# Patient Record
Sex: Male | Born: 1970
Health system: Southern US, Community
[De-identification: ages and names within clinical notes are randomized; demographics above are authoritative.]

## PROBLEM LIST (undated history)

## (undated) DIAGNOSIS — E785 Hyperlipidemia, unspecified: Secondary | ICD-10-CM

## (undated) DIAGNOSIS — E781 Pure hyperglyceridemia: Secondary | ICD-10-CM

## (undated) DIAGNOSIS — K219 Gastro-esophageal reflux disease without esophagitis: Secondary | ICD-10-CM

## (undated) DIAGNOSIS — I10 Essential (primary) hypertension: Secondary | ICD-10-CM

## (undated) DIAGNOSIS — N200 Calculus of kidney: Secondary | ICD-10-CM

## (undated) DIAGNOSIS — K760 Fatty (change of) liver, not elsewhere classified: Secondary | ICD-10-CM

## (undated) DIAGNOSIS — IMO0002 Reserved for concepts with insufficient information to code with codable children: Secondary | ICD-10-CM

## (undated) DIAGNOSIS — L4050 Arthropathic psoriasis, unspecified: Secondary | ICD-10-CM

## (undated) DIAGNOSIS — K861 Other chronic pancreatitis: Secondary | ICD-10-CM

## (undated) DIAGNOSIS — L400 Psoriasis vulgaris: Secondary | ICD-10-CM

## (undated) DIAGNOSIS — E119 Type 2 diabetes mellitus without complications: Secondary | ICD-10-CM

## (undated) DIAGNOSIS — K859 Acute pancreatitis without necrosis or infection, unspecified: Secondary | ICD-10-CM

## (undated) HISTORY — DX: Hyperlipidemia, unspecified: E78.5

## (undated) HISTORY — DX: Psoriasis vulgaris: L40.0

## (undated) HISTORY — DX: Fatty (change of) liver, not elsewhere classified: K76.0

## (undated) HISTORY — PX: ESOPHAGOGASTRODUODENOSCOPY: SHX1529

## (undated) HISTORY — DX: Reserved for concepts with insufficient information to code with codable children: IMO0002

## (undated) HISTORY — DX: Type 2 diabetes mellitus without complications: E11.9

## (undated) HISTORY — PX: OTHER SURGICAL HISTORY: SHX169

## (undated) HISTORY — PX: CHOLECYSTECTOMY OPEN: SUR202

## (undated) HISTORY — DX: Acute pancreatitis without necrosis or infection, unspecified: K85.90

## (undated) HISTORY — DX: Other chronic pancreatitis: K86.1

## (undated) HISTORY — DX: Pure hyperglyceridemia: E78.1

## (undated) HISTORY — DX: Calculus of kidney: N20.0

## (undated) HISTORY — DX: Gastro-esophageal reflux disease without esophagitis: K21.9

## (undated) HISTORY — DX: Arthropathic psoriasis, unspecified: L40.50

---

## 2014-08-03 ENCOUNTER — Encounter: Payer: Self-pay | Admitting: *Deleted

## 2014-08-05 ENCOUNTER — Ambulatory Visit (INDEPENDENT_AMBULATORY_CARE_PROVIDER_SITE_OTHER): Payer: BLUE CROSS/BLUE SHIELD | Admitting: Cardiology

## 2014-08-05 ENCOUNTER — Encounter: Payer: Self-pay | Admitting: Cardiology

## 2014-08-05 VITALS — BP 134/96 | HR 95 | Ht 60.0 in | Wt 180.0 lb

## 2014-08-05 DIAGNOSIS — K861 Other chronic pancreatitis: Secondary | ICD-10-CM

## 2014-08-05 DIAGNOSIS — IMO0002 Reserved for concepts with insufficient information to code with codable children: Secondary | ICD-10-CM

## 2014-08-05 DIAGNOSIS — E119 Type 2 diabetes mellitus without complications: Secondary | ICD-10-CM | POA: Insufficient documentation

## 2014-08-05 DIAGNOSIS — E781 Pure hyperglyceridemia: Secondary | ICD-10-CM

## 2014-08-05 DIAGNOSIS — E1169 Type 2 diabetes mellitus with other specified complication: Secondary | ICD-10-CM

## 2014-08-05 DIAGNOSIS — E1165 Type 2 diabetes mellitus with hyperglycemia: Secondary | ICD-10-CM

## 2014-08-05 HISTORY — DX: Pure hyperglyceridemia: E78.1

## 2014-08-05 HISTORY — DX: Other chronic pancreatitis: K86.1

## 2014-08-05 NOTE — Progress Notes (Signed)
Cardiology Office Note   Date:  08/05/2014   ID:  Marcus Paddynson J Lefebre, DOB 02-03-1971, MRN [id number]  PCP:  No primary care provider on file.  Cardiologist:   Quintella ReichertURNER,Naelle Diegel R, MD   No chief complaint on file.     History of Present Illness: Marcus Mitchell is a 44 y.o. male who presents for evaluation of hypertriglyceridemia.  He has a history of pancreatitis and was [facility]. He thinks he has had 12 episodes of pancreatitis in the past 3-4 years.  He is intolerant to Niacin and is taking crestor, fenofibrate and fish oil.  His most recent triglyceride level was 940.  He tries to follow a healthy diet.  He was recently placed on Metformin.  His triglycerides have been as high as 6000.  He has no family history of CAD in immediate family members.  Her denies any chest pain, pressure or SOB.  He gets chronic epigastric pain from his pancreatitis.  He denies any LE edema but denies any dizziness or syncope.  He denies palpitations.  He tries to follow a low fat diet but is not always compliant.  He eats out a lot of fast food.  He rarely exercises.     Past Medical History  Diagnosis Date  . Diabetes mellitus type II, controlled   . Kidney stones   . Plaque psoriasis   . Psoriatic arthritis   . Hyperlipemia   . Hypertriglyceridemia   . Pancreatitis, recurrent   . NAFLD (nonalcoholic fatty liver disease)   . GERD (gastroesophageal reflux disease)     Past Surgical History  Procedure Laterality Date  . Cholecystectomy open    . Esophagogastroduodenoscopy    . Duodenitis       Current Outpatient Prescriptions  Medication Sig Dispense Refill  . fenofibrate micronized (LOFIBRA) 134 MG capsule Take 134 mg by mouth daily. daily.    [name]. HUMIRA PEN 40 MG/0.8ML PNKT Every two weeks.  2  . Krill Oil 1000 MG CAPS Take by mouth daily.     [name]. loratadine (CLARITIN) 10 MG tablet Take 10 mg by mouth daily.    . metFORMIN (GLUCOPHAGE) 500 MG tablet 2 (two) times daily.      [name]. omeprazole (PRILOSEC OTC) 20 MG tablet Take 20 mg by mouth.    . rosuvastatin (CRESTOR) 10 MG tablet Take 10 mg by mouth daily. daily.     No current facility-administered medications for this visit.    Allergies:   Nsaids and Niacin and related    Social History:  The patient  reports that he has quit smoking. He does not have any smokeless tobacco history on file. He reports that he does not drink alcohol or use illicit drugs.   Family History:  The patient's family history includes Diabetes in his mother; Hyperlipidemia in his father; Hypertension in his mother.    ROS:  Please see the history of present illness.   Otherwise, review of systems are positive for none.   All other systems are reviewed and negative.    PHYSICAL EXAM: VS:  BP 134/96 mmHg  Pulse 95  Ht 5' (1.524 m)  Wt 180 lb (81.647 kg)  BMI 35.15 kg/m2 , BMI Body mass index is 35.15 kg/(m^2). GEN: Well nourished, well developed, in no acute distress HEENT: normal Neck: no JVD, carotid bruits, or masses Cardiac: RRR; no murmurs, rubs, or gallops,no edema  Respiratory:  clear to auscultation bilaterally, normal work of breathing GI: soft,  nontender, nondistended, + BS MS: no deformity or atrophy Skin: warm and dry, no rash Neuro:  Strength and sensation are intact Psych: euthymic mood, full affect   EKG:  EKG is ordered today. The ekg ordered today demonstrates NSR with no ST chagnes   Recent Labs: No results found for requested labs within last 365 days.    Lipid Panel No results found for: CHOL, TRIG, HDL, CHOLHDL, VLDL, LDLCALC, LDLDIRECT    Wt Readings from Last 3 Encounters:  08/05/14 180 lb (81.647 kg)      Other studies Reviewed: Additional studies/ records that were reviewed today include: records from PCP. Review of the above records demonstrates: severe hypertriglyceridemia   ASSESSMENT AND PLAN:  1.  Hypertriglyceridemia with levels as high at 6000 in the past resulting in  multiple episodes of acute pancreatitis.  This is most likely familial. He has no family history of heart disease.  He is on Crestor, fish oil and fenofibrate but has not tolerated Niaspan.  His last triglycerides were still elevated at 900.  He is not very compliant in following a low fat diet and eats fast food a lot.  He also does not exercise. I had a long conversation with him about the importance of joining and gym and getting into a routine exercise program as well as following a strict diet.  I will refer him to our lipid clinic as well as a nutritionist. 2.  Chronic pancreatitis due to #1 3.  DM type 2 - per PCP.  Currently on Metformin   Current medicines are reviewed at length with the patient today.  The patient does not have concerns regarding medicines.  The following changes have been made:  no change  Labs/ tests ordered today include: Non e No orders of the defined types were placed in this encounter.     Disposition:   FU with me in 6 months   Signed, Quintella Reichert, MD  08/05/2014 2:35 PM    [facility] [address] [place], [place], [place]  [address] Phone: [phone]; Fax: [phone]

## 2014-08-05 NOTE — Patient Instructions (Addendum)
You have been referred to Lipid Clinic.  You have been referred for a nutrition consult.  Your physician wants you to follow-up in: 6 months with Dr. Mayford Knifeurner. You will receive a reminder letter in the mail two months in advance. If you don't receive a letter, please call our office to schedule the follow-up appointment.

## 2014-08-12 ENCOUNTER — Ambulatory Visit (INDEPENDENT_AMBULATORY_CARE_PROVIDER_SITE_OTHER): Payer: BLUE CROSS/BLUE SHIELD | Admitting: Pharmacist

## 2014-08-12 DIAGNOSIS — E781 Pure hyperglyceridemia: Secondary | ICD-10-CM

## 2014-08-12 MED ORDER — OMEGA-3-ACID ETHYL ESTERS 1 G PO CAPS: 2.0000 g | ORAL_CAPSULE | Freq: Two times a day (BID) | ORAL | Status: DC

## 2014-08-12 MED ORDER — ROSUVASTATIN CALCIUM 10 MG PO TABS: 10.0000 mg | ORAL_TABLET | Freq: Every day | ORAL | Status: DC

## 2014-08-12 NOTE — Patient Instructions (Signed)
Change from Krill Oil to the prescription fish oil capsules (Lovaza)- 4 capsules per day.  Try to limit the amount of sugars, carbohydrates and fried foods in your diet.   Set a goal to exercise at least 15 minutes for 3-4 days of the week.   We will recheck your labs in 2 months.

## 2014-08-16 NOTE — Progress Notes (Signed)
HPI  [name] is a 44 yo M patient of Dr. Mayford Mitchell who was referred to the Lipid Clinic for a history of hypertriglyceridemia.  He has a history of pancreatitis and thinks he has had 12 episodes of pancreatitis in the past 3-4 years.  His most severe episode was last fall when he was admitted to the hospital.  His triglycerides have been as high as 6000 in the past but most recently they were 940 mg/dL.  He is currently taking Crestor 10mg  daily, fenofibrate 134 mg daily, and 1 Mega Red fish oil capsule a day.  He state he is intolerant to Niacin.  He was recently started on metformin but does not check his blood sugars at home.   Family history- non-significant.  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  Reports no alcohol intake   Dietary Review:  Breakfast- cereal such as Cheerios or Special K or a sandwich Lunch- has fast food ~50% of the time.  Likes [employer].  Will do a sit down meal at a restaurant the other time.  Will have a grilled chicken plate with vegetables or [place].  Tries to eat a salad at least once a week.   Dinner- Cooks at home 3-4 nights a week.  Likes pork roast, chicken, corn, green beans, asparagus, grilled peppers and onions.  Drinks coffee, flavored water, a few Diet Cokes Snacks on [employer] bars and does have a least one sweet per day.   Exercise:  Pt does not exercise at all now.    Current Outpatient Prescriptions  Medication Sig Dispense Refill  . fenofibrate micronized (LOFIBRA) 134 MG capsule Take 134 mg by mouth daily. daily.    [name]. HUMIRA PEN 40 MG/0.8ML PNKT Every two weeks.  2  . loratadine (CLARITIN) 10 MG tablet Take 10 mg by mouth daily.    . metFORMIN (GLUCOPHAGE) 500 MG tablet 2 (two) times daily.    [name]. omega-3 acid ethyl esters (LOVAZA) 1 G capsule Take 2 capsules (2 g total) by mouth 2 (two) times daily. 120 capsule 6  . omeprazole (PRILOSEC OTC) 20 MG tablet Take 20 mg by mouth.    . rosuvastatin (CRESTOR) 10 MG tablet  Take 1 tablet (10 mg total) by mouth daily. 90 tablet 1   No current facility-administered medications for this visit.   Allergies  Allergen Reactions  . Nsaids Hives  . Niacin And Related    Assessment and Plan 1.  Hypertriglyceridemia- Pt has a long history of elevated TG.  Unfortunately we do not have any copies of the labs.  He is on fenofibrate and statin.  Would increase fish oil to 4 gm per day.  Will send in Rx version since it is now generic.  Have also encouraged lifestyle improvements, mainly exercise.  Plan to recheck labs in 2 months.

## 2014-10-12 ENCOUNTER — Other Ambulatory Visit (INDEPENDENT_AMBULATORY_CARE_PROVIDER_SITE_OTHER): Payer: BLUE CROSS/BLUE SHIELD | Admitting: *Deleted

## 2014-10-12 DIAGNOSIS — E781 Pure hyperglyceridemia: Secondary | ICD-10-CM | POA: Diagnosis not present

## 2014-10-12 LAB — LIPID PANEL
Cholesterol: 185 mg/dL (ref 0–200)
HDL: 26.1 mg/dL — ABNORMAL LOW (ref >39.00)
Total CHOL/HDL Ratio: 7
Triglycerides: 986 mg/dL — ABNORMAL HIGH (ref 0.0–149.0)

## 2014-10-12 LAB — HEPATIC FUNCTION PANEL
ALBUMIN: 4.6 g/dL (ref 3.5–5.2)
ALT: 50 U/L (ref 0–53)
AST: 27 U/L (ref 0–37)
Alkaline Phosphatase: 46 U/L (ref 39–117)
BILIRUBIN TOTAL: 0.7 mg/dL (ref 0.2–1.2)
Bilirubin, Direct: 0.1 mg/dL (ref 0.0–0.3)
Total Protein: 7 g/dL (ref 6.0–8.3)

## 2014-10-12 LAB — LDL CHOLESTEROL, DIRECT: LDL DIRECT: 31 mg/dL

## 2014-10-12 NOTE — Addendum Note (Signed)
Addended by: Tonita PhoenixBOWDEN, Eleyna Brugh K on: 10/12/2014 08:33 AM   Modules accepted: Orders

## 2014-10-21 ENCOUNTER — Ambulatory Visit: Payer: BLUE CROSS/BLUE SHIELD | Admitting: Pharmacist

## 2014-11-24 ENCOUNTER — Encounter: Payer: Self-pay | Admitting: Cardiology

## 2014-11-25 ENCOUNTER — Encounter: Payer: Self-pay | Admitting: Cardiology

## 2015-02-09 ENCOUNTER — Other Ambulatory Visit: Payer: Self-pay | Admitting: Cardiology

## 2015-02-09 NOTE — Telephone Encounter (Signed)
Please advise on refill as patient no-showed his 10/21/14 lipid clinic appointment. Thanks, MI

## 2015-04-23 ENCOUNTER — Other Ambulatory Visit: Payer: Self-pay | Admitting: Cardiology

## 2015-04-25 NOTE — Telephone Encounter (Signed)
Please advise on refill request as patient is overdue for follow up in the lipid clinic. Thanks, MI

## 2015-05-19 ENCOUNTER — Other Ambulatory Visit: Payer: Self-pay | Admitting: Cardiology

## 2015-07-18 ENCOUNTER — Encounter (HOSPITAL_COMMUNITY): Payer: Self-pay | Admitting: *Deleted

## 2015-07-18 ENCOUNTER — Other Ambulatory Visit: Payer: Self-pay | Admitting: Gastroenterology

## 2015-07-19 NOTE — Anesthesia Preprocedure Evaluation (Signed)
Anesthesia Evaluation  Patient identified by MRN, date of birth, ID band Patient awake    Reviewed: Allergy & Precautions, H&P , NPO status , Patient's Chart, lab work & pertinent test results  Airway Mallampati: II  TM Distance: >3 FB Neck ROM: full    Dental no notable dental hx. (+) Teeth Intact, Dental Advisory Given   Pulmonary neg pulmonary ROS, former smoker,    Pulmonary exam normal breath sounds clear to auscultation       Cardiovascular Exercise Tolerance: Good hypertension, Pt. on medications negative cardio ROS Normal cardiovascular exam Rhythm:regular Rate:Normal     Neuro/Psych negative neurological ROS  negative psych ROS   GI/Hepatic negative GI ROS, Neg liver ROS, GERD  Medicated and Controlled,  Endo/Other  negative endocrine ROSdiabetes, Well Controlled, Type 2, Oral Hypoglycemic Agents  Renal/GU negative Renal ROS  negative genitourinary   Musculoskeletal   Abdominal   Peds  Hematology negative hematology ROS (+)   Anesthesia Other Findings   Reproductive/Obstetrics negative OB ROS                             Anesthesia Physical Anesthesia Plan  ASA: III  Anesthesia Plan: MAC   Post-op Pain Management:    Induction:   Airway Management Planned:   Additional Equipment:   Intra-op Plan:   Post-operative Plan:   Informed Consent: I have reviewed the patients History and Physical, chart, labs and discussed the procedure including the risks, benefits and alternatives for the proposed anesthesia with the patient or authorized representative who has indicated his/her understanding and acceptance.   Dental Advisory Given  Plan Discussed with: CRNA and Surgeon  Anesthesia Plan Comments:         Anesthesia Quick Evaluation

## 2015-07-20 ENCOUNTER — Ambulatory Visit (HOSPITAL_COMMUNITY)
Admission: RE | Admit: 2015-07-20 | Discharge: 2015-07-20 | Disposition: A | Discharge status: Home / Self Care | Payer: BLUE CROSS/BLUE SHIELD | Source: Ambulatory Visit | Attending: Gastroenterology | Admitting: Gastroenterology

## 2015-07-20 ENCOUNTER — Encounter (HOSPITAL_COMMUNITY)
Admission: RE | Disposition: A | Discharge status: Home / Self Care | Payer: Self-pay | Source: Ambulatory Visit | Attending: Gastroenterology

## 2015-07-20 ENCOUNTER — Ambulatory Visit (HOSPITAL_COMMUNITY): Payer: BLUE CROSS/BLUE SHIELD | Admitting: Anesthesiology

## 2015-07-20 ENCOUNTER — Encounter (HOSPITAL_COMMUNITY): Payer: Self-pay | Admitting: *Deleted

## 2015-07-20 DIAGNOSIS — I10 Essential (primary) hypertension: Secondary | ICD-10-CM | POA: Diagnosis not present

## 2015-07-20 DIAGNOSIS — R109 Unspecified abdominal pain: Secondary | ICD-10-CM | POA: Diagnosis present

## 2015-07-20 DIAGNOSIS — K861 Other chronic pancreatitis: Secondary | ICD-10-CM | POA: Diagnosis not present

## 2015-07-20 DIAGNOSIS — Z7984 Long term (current) use of oral hypoglycemic drugs: Secondary | ICD-10-CM | POA: Insufficient documentation

## 2015-07-20 DIAGNOSIS — K219 Gastro-esophageal reflux disease without esophagitis: Secondary | ICD-10-CM | POA: Diagnosis not present

## 2015-07-20 DIAGNOSIS — K8681 Exocrine pancreatic insufficiency: Secondary | ICD-10-CM | POA: Diagnosis not present

## 2015-07-20 DIAGNOSIS — E119 Type 2 diabetes mellitus without complications: Secondary | ICD-10-CM | POA: Insufficient documentation

## 2015-07-20 DIAGNOSIS — Z9049 Acquired absence of other specified parts of digestive tract: Secondary | ICD-10-CM | POA: Insufficient documentation

## 2015-07-20 DIAGNOSIS — Z79899 Other long term (current) drug therapy: Secondary | ICD-10-CM | POA: Diagnosis not present

## 2015-07-20 DIAGNOSIS — E781 Pure hyperglyceridemia: Secondary | ICD-10-CM | POA: Diagnosis not present

## 2015-07-20 DIAGNOSIS — Z87891 Personal history of nicotine dependence: Secondary | ICD-10-CM | POA: Diagnosis not present

## 2015-07-20 HISTORY — PX: EUS: SHX5427

## 2015-07-20 HISTORY — DX: Essential (primary) hypertension: I10

## 2015-07-20 LAB — GLUCOSE, CAPILLARY: Glucose-Capillary: 154 mg/dL — ABNORMAL HIGH (ref 65–99)

## 2015-07-20 SURGERY — UPPER ENDOSCOPIC ULTRASOUND (EUS) RADIAL
Anesthesia: Monitor Anesthesia Care

## 2015-07-20 MED ORDER — PROPOFOL 10 MG/ML IV BOLUS
INTRAVENOUS | Status: AC
  Filled 2015-07-20: qty 40

## 2015-07-20 MED ORDER — LIDOCAINE HCL (CARDIAC) 20 MG/ML IV SOLN
INTRAVENOUS | Status: DC | PRN
  Administered 2015-07-20: 100 mg via INTRAVENOUS

## 2015-07-20 MED ORDER — SODIUM CHLORIDE 0.9 % IV SOLN: INTRAVENOUS | Status: DC

## 2015-07-20 MED ORDER — PROPOFOL 10 MG/ML IV BOLUS
INTRAVENOUS | Status: DC | PRN
  Administered 2015-07-20: 20 mg via INTRAVENOUS
  Administered 2015-07-20: 10 mg via INTRAVENOUS
  Administered 2015-07-20: 20 mg via INTRAVENOUS
  Administered 2015-07-20: 50 mg via INTRAVENOUS
  Administered 2015-07-20: 20 mg via INTRAVENOUS

## 2015-07-20 MED ORDER — LACTATED RINGERS IV SOLN: INTRAVENOUS | Status: DC

## 2015-07-20 MED ORDER — LIDOCAINE HCL (CARDIAC) 20 MG/ML IV SOLN
INTRAVENOUS | Status: AC
  Filled 2015-07-20: qty 5

## 2015-07-20 MED ORDER — FENTANYL CITRATE (PF) 100 MCG/2ML IJ SOLN
INTRAMUSCULAR | Status: AC
  Filled 2015-07-20: qty 2

## 2015-07-20 MED ORDER — FENTANYL CITRATE (PF) 100 MCG/2ML IJ SOLN
INTRAMUSCULAR | Status: DC | PRN
  Administered 2015-07-20 (×2): 50 ug via INTRAVENOUS

## 2015-07-20 MED ORDER — FENTANYL CITRATE (PF) 100 MCG/2ML IJ SOLN: 25.0000 ug | INTRAMUSCULAR | Status: DC | PRN

## 2015-07-20 MED ORDER — LACTATED RINGERS IV SOLN
INTRAVENOUS | Status: DC
  Administered 2015-07-20: 1000 mL via INTRAVENOUS

## 2015-07-20 MED ORDER — PROPOFOL 500 MG/50ML IV EMUL
INTRAVENOUS | Status: DC | PRN
  Administered 2015-07-20: 140 ug/kg/min via INTRAVENOUS

## 2015-07-20 MED ORDER — MIDAZOLAM HCL 5 MG/5ML IJ SOLN
INTRAMUSCULAR | Status: DC | PRN
  Administered 2015-07-20: 1 mg via INTRAVENOUS

## 2015-07-20 MED ORDER — MIDAZOLAM HCL 2 MG/2ML IJ SOLN
INTRAMUSCULAR | Status: AC
  Filled 2015-07-20: qty 2

## 2015-07-20 NOTE — Op Note (Signed)
[facility] [address] [place], [address]   ENDOSCOPIC ULTRASOUND PROCEDURE REPORT  PATIENT: Marcus Mitchell, Marcus Mitchell  MR#: [id number] BIRTHDATE: 1970/07/11  GENDER: male ENDOSCOPIST: Willis Modena, MD REFERRED BY:  Charlott Rakes, M.D. PROCEDURE DATE:  07/20/2015 PROCEDURE:   Upper EUS ASA CLASS:      Class III INDICATIONS:   1.  abdominal pain, recurrent pancreatitis. MEDICATIONS: Monitored anesthesia care  DESCRIPTION OF PROCEDURE:   After the risks benefits and alternatives of the procedure were  explained, informed consent was obtained. The patient was then placed in the left, lateral, decubitus postion and IV sedation was administered. Throughout the procedure, the patients blood pressure, pulse and oxygen saturations were monitored continuously.  Under direct visualization, the radial forward-viewing  echoendoscope was introduced through the mouth  and advanced to the second portion of the duodenum .  Water was used as necessary to provide an acoustic interface. Estimated blood loss is zero unless otherwise noted in this procedure report. Upon completion of the imaging, water was removed and the patient was sent to the recovery room in satisfactory condition.    FINDINGS:      EGD:  Limited EGD normal esophagus, stomach, pylorus and duodenum to the second portion. EUS:  Extensive hyperechoic strands, hyperechoic foci and lobularity, findings readily compatible with chronic pancreatitis. No pancreatic mass seen.  Bile duct non-dilated and no bile duct stones seen.  Post-cholecystectomy.  IMPRESSION:     As above.  Chronic pancreatitis noted.  RECOMMENDATIONS:     1.  Watch for potential complications of procedure. 2.  Trial of pancreatic enzymes. 3.  Patient needs more rigorous control of triglyerides; endocrinologist with expertise in lipid management might be useful for this patient's extremely medically refractory  hereditary hypertriglyceridemia. 4.  Follow-up with Dr. Bosie Clos.    _______________________________ Rosalie DoctorWillis Modena, MD 07/20/2015 9:11 AM   CC:

## 2015-07-20 NOTE — Anesthesia Postprocedure Evaluation (Signed)
Anesthesia Post Note  Patient: Marcus Mitchell  Procedure(s) Performed: Procedure(s) (LRB): UPPER ENDOSCOPIC ULTRASOUND (EUS) RADIAL (N/A)  Patient location during evaluation: PACU Anesthesia Type: MAC Level of consciousness: awake and alert Pain management: pain level controlled Vital Signs Assessment: post-procedure vital signs reviewed and stable Respiratory status: spontaneous breathing, nonlabored ventilation, respiratory function stable and patient connected to nasal cannula oxygen Cardiovascular status: blood pressure returned to baseline and stable Postop Assessment: no signs of nausea or vomiting Anesthetic complications: no    Last Vitals:  Filed Vitals:   07/20/15 0930 07/20/15 0935  BP: 135/89   Pulse: 70 64  Temp:    Resp: 21 22    Last Pain: There were no vitals filed for this visit.               Peityn Payton L

## 2015-07-20 NOTE — H&P (Signed)
Patient interval history reviewed.  Patient examined again.  There has been no change from documented H/P dated 07/14/15 (scanned into chart from our office) except as documented above.  Assessment:  1.  Recurrent pancreatitis, possibly hypertrigylceridemia-related. 2.  Abdominal pain.  Plan:  1.  Endoscopic ultrasound. 2.  Risks (bleeding, infection, bowel perforation that could require surgery, sedation-related changes in cardiopulmonary systems), benefits (identification and possible treatment of source of symptoms, exclusion of certain causes of symptoms), and alternatives (watchful waiting, radiographic imaging studies, empiric medical treatment) of upper endoscopy with ultrasound (EUS) were explained to patient/family in detail and patient wishes to proceed.

## 2015-07-20 NOTE — Discharge Instructions (Signed)
Endoscopic Ultrasound ° °Care After °Please read the instructions outlined below and refer to this sheet in the next few weeks. These discharge instructions provide you with general information on caring for yourself after you leave the hospital. Your doctor may also give you specific instructions. While your treatment has been planned according to the most current medical practices available, unavoidable complications occasionally occur. If you have any problems or questions after discharge, please call Dr. Kashtyn Jankowski ([facility]) at [phone]. ° °HOME CARE INSTRUCTIONS °Activity °· You may resume your regular activity but move at a slower pace for the next 24 hours.  °· Take frequent rest periods for the next 24 hours.  °· Walking will help expel (get rid of) the air and reduce the bloated feeling in your abdomen.  °· No driving for 24 hours (because of the anesthesia (medicine) used during the test).  °· You may shower.  °· Do not sign any important legal documents or operate any machinery for 24 hours (because of the anesthesia used during the test).  °Nutrition °· Drink plenty of fluids.  °· You may resume your normal diet.  °· Begin with a light meal and progress to your normal diet.  °· Avoid alcoholic beverages for 24 hours or as instructed by your caregiver.  °Medications °You may resume your normal medications unless your caregiver tells you otherwise. °What you can expect today °· You may experience abdominal discomfort such as a feeling of fullness or "gas" pains.  °· You may experience a sore throat for 2 to 3 days. This is normal. Gargling with salt water may help this.  °·  °SEEK IMMEDIATE MEDICAL CARE IF: °· You have excessive nausea (feeling sick to your stomach) and/or vomiting.  °· You have severe abdominal pain and distention (swelling).  °· You have trouble swallowing.  °· You have a temperature over 100° F (37.8° C).  °· You have rectal bleeding or vomiting of blood.  °Document  Released: 12/20/2003 Document Revised: 01/17/2011 Document Reviewed: 07/02/2007 °ExitCare® Patient Information ©2012 [employer]. °

## 2015-07-20 NOTE — Transfer of Care (Signed)
Immediate Anesthesia Transfer of Care Note  Patient: Marcus Mitchell  Procedure(s) Performed: Procedure(s): UPPER ENDOSCOPIC ULTRASOUND (EUS) RADIAL (N/A)  Patient Location: Endoscopy Unit  Anesthesia Type:MAC  Level of Consciousness: awake  Airway & Oxygen Therapy: Patient Spontanous Breathing  Post-op Assessment: Report given to RN and Post -op Vital signs reviewed and stable  Post vital signs: Reviewed and stable  Last Vitals:  Filed Vitals:   07/20/15 0726  BP: 138/93  Pulse: 75  Temp: 36.7 C  Resp: 10    Complications: No apparent anesthesia complications

## 2015-07-25 ENCOUNTER — Encounter (HOSPITAL_COMMUNITY): Payer: Self-pay | Admitting: Gastroenterology

## 2015-08-25 DIAGNOSIS — I10 Essential (primary) hypertension: Secondary | ICD-10-CM | POA: Diagnosis not present

## 2015-08-25 DIAGNOSIS — E1165 Type 2 diabetes mellitus with hyperglycemia: Secondary | ICD-10-CM | POA: Diagnosis not present

## 2015-08-25 DIAGNOSIS — E785 Hyperlipidemia, unspecified: Secondary | ICD-10-CM | POA: Diagnosis not present

## 2015-08-25 DIAGNOSIS — D696 Thrombocytopenia, unspecified: Secondary | ICD-10-CM | POA: Diagnosis not present

## 2015-09-14 DIAGNOSIS — E669 Obesity, unspecified: Secondary | ICD-10-CM | POA: Diagnosis not present

## 2015-09-14 DIAGNOSIS — E781 Pure hyperglyceridemia: Secondary | ICD-10-CM | POA: Diagnosis not present

## 2015-09-16 DIAGNOSIS — E781 Pure hyperglyceridemia: Secondary | ICD-10-CM | POA: Diagnosis not present

## 2015-09-16 DIAGNOSIS — E669 Obesity, unspecified: Secondary | ICD-10-CM | POA: Diagnosis not present

## 2015-10-11 DIAGNOSIS — M67911 Unspecified disorder of synovium and tendon, right shoulder: Secondary | ICD-10-CM | POA: Diagnosis not present

## 2015-10-14 ENCOUNTER — Ambulatory Visit: Payer: BLUE CROSS/BLUE SHIELD | Admitting: Skilled Nursing Facility1

## 2015-10-18 DIAGNOSIS — M25511 Pain in right shoulder: Secondary | ICD-10-CM | POA: Diagnosis not present

## 2015-10-20 DIAGNOSIS — M67911 Unspecified disorder of synovium and tendon, right shoulder: Secondary | ICD-10-CM | POA: Diagnosis not present

## 2015-10-25 DIAGNOSIS — M25511 Pain in right shoulder: Secondary | ICD-10-CM | POA: Diagnosis not present

## 2015-11-02 DIAGNOSIS — M25511 Pain in right shoulder: Secondary | ICD-10-CM | POA: Diagnosis not present

## 2015-11-04 DIAGNOSIS — M25511 Pain in right shoulder: Secondary | ICD-10-CM | POA: Diagnosis not present

## 2015-11-07 DIAGNOSIS — M25511 Pain in right shoulder: Secondary | ICD-10-CM | POA: Diagnosis not present

## 2015-11-14 ENCOUNTER — Encounter: Payer: Self-pay | Admitting: Dietician

## 2015-11-14 ENCOUNTER — Encounter: Payer: BLUE CROSS/BLUE SHIELD | Attending: Internal Medicine | Admitting: Dietician

## 2015-11-14 VITALS — Ht 65.0 in | Wt 182.0 lb

## 2015-11-14 DIAGNOSIS — M25511 Pain in right shoulder: Secondary | ICD-10-CM | POA: Diagnosis not present

## 2015-11-14 DIAGNOSIS — E119 Type 2 diabetes mellitus without complications: Secondary | ICD-10-CM

## 2015-11-14 DIAGNOSIS — Z713 Dietary counseling and surveillance: Secondary | ICD-10-CM | POA: Insufficient documentation

## 2015-11-14 DIAGNOSIS — E781 Pure hyperglyceridemia: Secondary | ICD-10-CM | POA: Diagnosis not present

## 2015-11-14 NOTE — Progress Notes (Signed)
Medical Nutrition Therapy:  Appt start time: 1530 end time:  1645.   Assessment:  Primary concerns today: Patient is here alone for referral for severe hypertriglyceridemia and type 2 diabetes.  Patient wants to learn how to eat better and clear up confusion about what this means.  Hx includes:  Type 2 diabetes, GERD, hypertriglyceridemia, HTN, CKD, psoriasis and psoriatic arthritis, pancreatitis (due to extremely elevated triglycerides).  Labs:  Cholesterol 185, Triglycerides 986, HDL 26, LDL 31.  He cannot remember what his last A1C was but states that it is good controlled.    Weight hx: 180 for years 182 today  Patient lives with his wife and child.  Both share the shopping and cooking.  Wife cooks "very Southern".  He is an Youth workerassistant pastor.  Preferred Learning Style:   No preference indicated   Learning Readiness:   Ready  MEDICATIONS: see list to include Metformin, humira, fenofibrate, omega-3, and crestor.   DIETARY INTAKE:  Usual eating pattern includes 2-3 meals and 1-2 snacks per day.  Everyday foods include eating out.  Avoided foods include regular and diet soda, alcohol.    24-hr recall:  B ( AM): PB & Jelly sandwich OR Bacon, egg, and cheese biscuit OR just coffee   Snk ( AM): granola bar or Belvita  L ( PM): Out to eat (fast food and sit down) Snk ( PM): none D (6 PM): chicken and broccoli and sometimes a starch, occasionally fried squash OR spaghetti, garlic bread, salad Snk ( PM): cereal (captain crunch) Beverages: water, flavored water, coffee with cream and artificial sweetener (2x/week), hot tea  Usual physical activity: yard work, occasional basketball or Frisbee with his son.  Estimated energy needs: 1600 calories 200 g carbohydrates 120 g protein 40 g fat  Progress Towards Goal(s):  In progress.   Nutritional Diagnosis:  NB-1.1 Food and nutrition-related knowledge deficit As related to balance of carbohydrates, protein, and fat.  As evidenced  by diet hx.    Intervention:  Nutrition counseling/education related to hypertriglyceridemia and type 2 diabetes.  Recommendations appropriate for slow weight loss.  Discussed importance of a low saturated fat diet limited in total fat with moderate carbohydrates.  Discussed carb counting by portion size, importance of exercise and learning more about food chosen when out to eat. Discussed mindful eating.  Aim for 3 Carb Choices per meal (45 grams) +/- 1 either way  Aim for 0-1 Carbs per snack if hungry  Include protein in moderation with your meals and snacks Read food labels for Total Carbohydrate and Fat Grams and saturated fat   Limit fat to 40-50 grams per day. Consider taking medication as directed by MD  Stay as active as possible.  Aim for 30 minutes most days.  Choose things that you enjoy. Variety is good. Consider computer resources or apps for further information about foods eaten out.  Calorie King.com  My Fitness Pal  Google American Expres[employer]he restaurant name and nutrition to get the nutrition content of their menu Increase your non starchy vegetables, choose whole grains and dried beans.  These are all great sources of fiber. Choose mindfully, eat slowly, stop when you are satisfied.  Teaching Method Utilized:  Visual Auditory Hands on  Handouts given during visit include:  Hypertriglyceridemia Nutrition Therapy  Hypertriglyceridemia Meal planning tips  Pancreatitis nutrition therapy  Meal plan card  Label reading  My plate  Barriers to learning/adherence to lifestyle change: none  Demonstrated degree of understanding via:  Teach Back  Monitoring/Evaluation:  Dietary intake, exercise, label readind, and body weight prn.

## 2015-11-14 NOTE — Patient Instructions (Addendum)
Aim for 3 Carb Choices per meal (45 grams) +/- 1 either way  Aim for 0-1 Carbs per snack if hungry  Include protein in moderation with your meals and snacks Read food labels for Total Carbohydrate and Fat Grams and saturated fat   Limit fat to 40-50 grams per day. Consider taking medication as directed by MD  Stay as active as possible.  Aim for 30 minutes most days.  Choose things that you enjoy. Variety is good. Consider computer resources or apps for further information about foods eaten out.  Calorie King.com  My Fitness Pal  Google American Expres[employer]he restaurant name and nutrition to get the nutrition content of their menu Increase your non starchy vegetables, choose whole grains and dried beans.  These are all great sources of fiber. Choose mindfully, eat slowly, stop when you are satisfied.

## 2015-11-16 DIAGNOSIS — M25511 Pain in right shoulder: Secondary | ICD-10-CM | POA: Diagnosis not present

## 2015-12-13 DIAGNOSIS — H524 Presbyopia: Secondary | ICD-10-CM | POA: Diagnosis not present

## 2015-12-13 DIAGNOSIS — H5200 Hypermetropia, unspecified eye: Secondary | ICD-10-CM | POA: Diagnosis not present

## 2015-12-27 DIAGNOSIS — L4050 Arthropathic psoriasis, unspecified: Secondary | ICD-10-CM | POA: Diagnosis not present

## 2015-12-27 DIAGNOSIS — L409 Psoriasis, unspecified: Secondary | ICD-10-CM | POA: Diagnosis not present

## 2015-12-27 DIAGNOSIS — Z79899 Other long term (current) drug therapy: Secondary | ICD-10-CM | POA: Diagnosis not present

## 2016-02-06 DIAGNOSIS — K861 Other chronic pancreatitis: Secondary | ICD-10-CM | POA: Diagnosis not present

## 2016-02-27 DIAGNOSIS — E781 Pure hyperglyceridemia: Secondary | ICD-10-CM | POA: Diagnosis not present

## 2016-02-27 DIAGNOSIS — E1165 Type 2 diabetes mellitus with hyperglycemia: Secondary | ICD-10-CM | POA: Diagnosis not present

## 2016-02-27 DIAGNOSIS — D696 Thrombocytopenia, unspecified: Secondary | ICD-10-CM | POA: Diagnosis not present

## 2016-02-27 DIAGNOSIS — E785 Hyperlipidemia, unspecified: Secondary | ICD-10-CM | POA: Diagnosis not present

## 2016-02-27 DIAGNOSIS — I10 Essential (primary) hypertension: Secondary | ICD-10-CM | POA: Diagnosis not present

## 2016-02-27 DIAGNOSIS — Z23 Encounter for immunization: Secondary | ICD-10-CM | POA: Diagnosis not present

## 2016-04-30 DIAGNOSIS — K760 Fatty (change of) liver, not elsewhere classified: Secondary | ICD-10-CM | POA: Diagnosis not present

## 2016-04-30 DIAGNOSIS — Z79899 Other long term (current) drug therapy: Secondary | ICD-10-CM | POA: Diagnosis not present

## 2016-04-30 DIAGNOSIS — L4050 Arthropathic psoriasis, unspecified: Secondary | ICD-10-CM | POA: Diagnosis not present

## 2016-04-30 DIAGNOSIS — L409 Psoriasis, unspecified: Secondary | ICD-10-CM | POA: Diagnosis not present

## 2016-07-05 DIAGNOSIS — R197 Diarrhea, unspecified: Secondary | ICD-10-CM | POA: Diagnosis not present

## 2016-07-10 DIAGNOSIS — R197 Diarrhea, unspecified: Secondary | ICD-10-CM | POA: Diagnosis not present

## 2016-08-29 DIAGNOSIS — R197 Diarrhea, unspecified: Secondary | ICD-10-CM | POA: Diagnosis not present

## 2016-08-29 DIAGNOSIS — K640 First degree hemorrhoids: Secondary | ICD-10-CM | POA: Diagnosis not present

## 2016-10-30 DIAGNOSIS — L409 Psoriasis, unspecified: Secondary | ICD-10-CM | POA: Diagnosis not present

## 2016-10-30 DIAGNOSIS — L4050 Arthropathic psoriasis, unspecified: Secondary | ICD-10-CM | POA: Diagnosis not present

## 2016-10-30 DIAGNOSIS — Z79899 Other long term (current) drug therapy: Secondary | ICD-10-CM | POA: Diagnosis not present

## 2016-12-06 DIAGNOSIS — Z23 Encounter for immunization: Secondary | ICD-10-CM | POA: Diagnosis not present

## 2016-12-06 DIAGNOSIS — R0789 Other chest pain: Secondary | ICD-10-CM | POA: Diagnosis not present

## 2016-12-06 DIAGNOSIS — E1165 Type 2 diabetes mellitus with hyperglycemia: Secondary | ICD-10-CM | POA: Diagnosis not present

## 2016-12-06 DIAGNOSIS — I10 Essential (primary) hypertension: Secondary | ICD-10-CM | POA: Diagnosis not present

## 2016-12-06 DIAGNOSIS — E7800 Pure hypercholesterolemia, unspecified: Secondary | ICD-10-CM | POA: Diagnosis not present

## 2016-12-06 DIAGNOSIS — Z0000 Encounter for general adult medical examination without abnormal findings: Secondary | ICD-10-CM | POA: Diagnosis not present

## 2016-12-10 ENCOUNTER — Telehealth: Payer: Self-pay

## 2016-12-10 ENCOUNTER — Telehealth: Payer: Self-pay | Admitting: Cardiology

## 2016-12-10 NOTE — Telephone Encounter (Signed)
I fine with that

## 2016-12-10 NOTE — Telephone Encounter (Signed)
NOTES IN SCHEDULING 

## 2016-12-10 NOTE — Telephone Encounter (Signed)
New message     Pt requesting to be seen by another cardiologist , he would like to transfer from Dr Mayford Knifeurner to another doctor in the office .

## 2017-01-15 DIAGNOSIS — K861 Other chronic pancreatitis: Secondary | ICD-10-CM | POA: Diagnosis not present

## 2017-01-15 DIAGNOSIS — R197 Diarrhea, unspecified: Secondary | ICD-10-CM | POA: Diagnosis not present

## 2017-01-28 DIAGNOSIS — J069 Acute upper respiratory infection, unspecified: Secondary | ICD-10-CM | POA: Diagnosis not present

## 2017-03-12 DIAGNOSIS — E781 Pure hyperglyceridemia: Secondary | ICD-10-CM | POA: Diagnosis not present

## 2017-03-12 DIAGNOSIS — K859 Acute pancreatitis without necrosis or infection, unspecified: Secondary | ICD-10-CM | POA: Diagnosis not present

## 2017-03-12 DIAGNOSIS — K861 Other chronic pancreatitis: Secondary | ICD-10-CM | POA: Diagnosis not present

## 2017-03-13 ENCOUNTER — Ambulatory Visit: Payer: BLUE CROSS/BLUE SHIELD | Admitting: Cardiology

## 2017-03-18 DIAGNOSIS — L4050 Arthropathic psoriasis, unspecified: Secondary | ICD-10-CM | POA: Diagnosis not present

## 2017-03-18 DIAGNOSIS — Z79899 Other long term (current) drug therapy: Secondary | ICD-10-CM | POA: Diagnosis not present

## 2017-03-27 DIAGNOSIS — K861 Other chronic pancreatitis: Secondary | ICD-10-CM | POA: Diagnosis not present

## 2017-03-27 DIAGNOSIS — Z6829 Body mass index (BMI) 29.0-29.9, adult: Secondary | ICD-10-CM | POA: Diagnosis not present

## 2017-03-27 DIAGNOSIS — E781 Pure hyperglyceridemia: Secondary | ICD-10-CM | POA: Diagnosis not present

## 2017-03-31 DIAGNOSIS — K760 Fatty (change of) liver, not elsewhere classified: Secondary | ICD-10-CM | POA: Diagnosis not present

## 2017-03-31 DIAGNOSIS — K859 Acute pancreatitis without necrosis or infection, unspecified: Secondary | ICD-10-CM | POA: Diagnosis not present

## 2017-03-31 DIAGNOSIS — R161 Splenomegaly, not elsewhere classified: Secondary | ICD-10-CM | POA: Diagnosis not present

## 2017-04-17 ENCOUNTER — Ambulatory Visit: Payer: BLUE CROSS/BLUE SHIELD | Admitting: Cardiology

## 2017-06-11 DIAGNOSIS — E1165 Type 2 diabetes mellitus with hyperglycemia: Secondary | ICD-10-CM | POA: Diagnosis not present

## 2017-06-11 DIAGNOSIS — E7800 Pure hypercholesterolemia, unspecified: Secondary | ICD-10-CM | POA: Diagnosis not present

## 2017-06-11 DIAGNOSIS — D696 Thrombocytopenia, unspecified: Secondary | ICD-10-CM | POA: Diagnosis not present

## 2017-06-11 DIAGNOSIS — I10 Essential (primary) hypertension: Secondary | ICD-10-CM | POA: Diagnosis not present

## 2017-08-26 DIAGNOSIS — J0101 Acute recurrent maxillary sinusitis: Secondary | ICD-10-CM | POA: Diagnosis not present

## 2017-08-26 DIAGNOSIS — R05 Cough: Secondary | ICD-10-CM | POA: Diagnosis not present

## 2017-10-29 DIAGNOSIS — L4050 Arthropathic psoriasis, unspecified: Secondary | ICD-10-CM | POA: Diagnosis not present

## 2017-10-29 DIAGNOSIS — Z79899 Other long term (current) drug therapy: Secondary | ICD-10-CM | POA: Diagnosis not present

## 2017-10-29 DIAGNOSIS — L409 Psoriasis, unspecified: Secondary | ICD-10-CM | POA: Diagnosis not present

## 2018-01-09 DIAGNOSIS — Z0000 Encounter for general adult medical examination without abnormal findings: Secondary | ICD-10-CM | POA: Diagnosis not present

## 2018-01-09 DIAGNOSIS — E1169 Type 2 diabetes mellitus with other specified complication: Secondary | ICD-10-CM | POA: Diagnosis not present

## 2018-01-09 DIAGNOSIS — D696 Thrombocytopenia, unspecified: Secondary | ICD-10-CM | POA: Diagnosis not present

## 2018-01-14 DIAGNOSIS — E7800 Pure hypercholesterolemia, unspecified: Secondary | ICD-10-CM | POA: Diagnosis not present

## 2018-01-14 DIAGNOSIS — E1169 Type 2 diabetes mellitus with other specified complication: Secondary | ICD-10-CM | POA: Diagnosis not present

## 2018-01-14 DIAGNOSIS — E782 Mixed hyperlipidemia: Secondary | ICD-10-CM | POA: Diagnosis not present

## 2018-03-05 DIAGNOSIS — Z79899 Other long term (current) drug therapy: Secondary | ICD-10-CM | POA: Diagnosis not present

## 2018-03-05 DIAGNOSIS — L409 Psoriasis, unspecified: Secondary | ICD-10-CM | POA: Diagnosis not present

## 2018-03-05 DIAGNOSIS — L4050 Arthropathic psoriasis, unspecified: Secondary | ICD-10-CM | POA: Diagnosis not present

## 2018-03-07 ENCOUNTER — Telehealth: Payer: Self-pay | Admitting: *Deleted

## 2018-03-07 ENCOUNTER — Ambulatory Visit: Payer: BLUE CROSS/BLUE SHIELD

## 2018-03-07 NOTE — Telephone Encounter (Signed)
Left message for patient to call and schedule LIPID appointment with Dr. Rennis Golden

## 2018-03-27 ENCOUNTER — Encounter: Payer: Self-pay | Admitting: Internal Medicine

## 2018-03-27 ENCOUNTER — Ambulatory Visit: Payer: BLUE CROSS/BLUE SHIELD | Admitting: Internal Medicine

## 2018-03-27 VITALS — BP 132/97 | HR 89 | Ht 65.0 in | Wt 179.4 lb

## 2018-03-27 DIAGNOSIS — E781 Pure hyperglyceridemia: Secondary | ICD-10-CM | POA: Diagnosis not present

## 2018-03-27 DIAGNOSIS — K859 Acute pancreatitis without necrosis or infection, unspecified: Secondary | ICD-10-CM | POA: Diagnosis not present

## 2018-03-27 DIAGNOSIS — E119 Type 2 diabetes mellitus without complications: Secondary | ICD-10-CM

## 2018-03-27 NOTE — Patient Instructions (Signed)
Medication Instructions:  Continue current medications - med changes will depend on lab results If you need a refill on your cardiac medications before your next appointment, please call your pharmacy.   Lab work: FASTING lab work to check cholesterol & A1C If you have labs (blood work) drawn today and your tests are completely normal, you will receive your results only by: [name] MyChart Message (if you have MyChart) OR . A paper copy in the mail If you have any lab test that is abnormal or we need to change your treatment, we will call you to review the results.   Follow-Up: At [facility], you and your health needs are our priority.  As part of our continuing mission to provide you with exceptional heart care, we have created designated Provider Care Teams.  These Care Teams include your primary Cardiologist (physician) and Advanced Practice Providers (APPs -  Physician Assistants and Nurse Practitioners) who all work together to provide you with the care you need, when you need it. . You will need a follow up appointment in 3 months with Marcus Mitchell *LIPID CLINIC*  Any Other Special Instructions Will Be Listed Below (If Applicable).

## 2018-03-27 NOTE — Progress Notes (Signed)
LIPID CLINIC CONSULT NOTE  Chief Complaint:  High triglycerides  Primary Care Physician: Tally Joe, MD  HPI:  Marcus Mitchell is a 47 y.o. male who is being seen today for the evaluation of high triglyceride at the request of Tally Joe, MD.  This is a very pleasant 47 year old male with a history of high triglycerides for more than 20 years.  He has had very difficult time controlling them and this is led partially toward pancreatitis which is been recurrent.  He is followed by Dr. Bosie Clos and only recently has had some improvement in his pancreatic attacks.  He was on metformin for diabetes and recently came off of that due to side effects and his hemoglobin A1c remains elevated over 7.  In addition he has risk factors including hypertension and a family history of hypertension and dyslipidemia.  Is unclear whether or triglyceride elevations run in his family but have been noted to be as high as in the 4000s in the past.  One point he was referred to see Dr. Tenny Craw at [facility] in the lipid clinic and triglycerides were markedly elevated then.  Most recently though he has had some improvement in his numbers and as of August 2019, his total cholesterol was 204 triglycerides were 1307.  He has been managed on long-standing Lovaza 2 g twice daily as well as rosuvastatin 10 mg daily.  In addition he has psoriasis and psoriatic arthritis on Humira.  He has no known history of coronary artery disease.  PMHx:  Past Medical History:  Diagnosis Date  . Chronic recurrent pancreatitis (HCC) 08/05/2014  . Diabetes mellitus type II, controlled (HCC)   . GERD (gastroesophageal reflux disease)   . Hyperlipemia   . Hypertension   . Hypertriglyceridemia   . Hypertriglyceridemia, familial 08/05/2014  . Kidney stones   . NAFLD (nonalcoholic fatty liver disease)   . Pancreatitis, recurrent   . Plaque psoriasis   . Psoriatic arthritis [facility])     Past Surgical History:  Procedure Laterality Date  .  CHOLECYSTECTOMY OPEN    . DUODENITIS    . ESOPHAGOGASTRODUODENOSCOPY    . EUS N/A 07/20/2015   Procedure: UPPER ENDOSCOPIC ULTRASOUND (EUS) RADIAL;  Surgeon: Willis Modena, MD;  Location: [facility];  Service: Endoscopy;  Laterality: N/A;    FAMHx:  Family History  Problem Relation Age of Onset  . Hypertension Mother   . Diabetes Mother   . Hyperlipidemia Father     SOCHx:   reports that he has quit smoking. His smoking use included cigarettes. He has never used smokeless tobacco. He reports that he does not drink alcohol or use drugs.  ALLERGIES:  Allergies  Allergen Reactions  . Nsaids Hives  . Niacin And Related Other (See Comments)    flushing of face    ROS: Pertinent items noted in HPI and remainder of comprehensive ROS otherwise negative.  HOME MEDS: Current Outpatient Medications on File Prior to Visit  Medication Sig Dispense Refill  . fenofibrate micronized (LOFIBRA) 134 MG capsule Take 134 mg by mouth daily. daily.    [name] HUMIRA PEN 40 MG/0.8ML PNKT Inject 40 mg into the skin every 14 (fourteen) days.   2  . loratadine (CLARITIN) 10 MG tablet Take 10 mg by mouth daily.    [name] losartan (COZAAR) 25 MG tablet Take 25 mg by mouth daily before breakfast.    . metFORMIN (GLUCOPHAGE) 500 MG tablet Take 500 mg by mouth daily with breakfast.     .  omega-3 acid ethyl esters (LOVAZA) 1 G capsule Take 2 capsules (2 g total) by mouth 2 (two) times daily. 120 capsule 6  . omeprazole (PRILOSEC OTC) 20 MG tablet Take 20 mg by mouth daily.     . rosuvastatin (CRESTOR) 10 MG tablet Take 1 tablet (10 mg total) by mouth daily. 30 tablet 5   No current facility-administered medications on file prior to visit.     LABS/IMAGING: No results found for this or any previous visit (from the past 48 hour(s)). No results found.  LIPID PANEL:    Component Value Date/Time   CHOL 185 10/12/2014 0833   TRIG (H) 10/12/2014 0833    986.0 Triglyceride is over 400; calculations on Lipids are  invalid.   HDL 26.10 (L) 10/12/2014 0833   CHOLHDL 7 10/12/2014 0833   LDLDIRECT 31.0 10/12/2014 0833    WEIGHTS: Wt Readings from Last 3 Encounters:  03/27/18 179 lb 6.4 oz (81.4 kg)  11/14/15 182 lb (82.6 kg)  07/20/15 180 lb (81.6 kg)    VITALS: BP (!) 132/97   Pulse 89   Ht 5\' 5"  (1.651 m)   Wt 179 lb 6.4 oz (81.4 kg)   BMI 29.85 kg/m   EXAM: General appearance: alert and no distress Neck: no carotid bruit, no JVD and thyroid not enlarged, symmetric, no tenderness/mass/nodules Lungs: clear to auscultation bilaterally Heart: regular rate and rhythm, S1, S2 normal, no murmur, click, rub or gallop Abdomen: soft, non-tender; bowel sounds normal; no masses,  no organomegaly Extremities: extremities normal, atraumatic, no cyanosis or edema Pulses: 2+ and symmetric Skin: Skin color, texture, turgor normal. No rashes or lesions Neurologic: Grossly normal Psych: Pleasant  EKG: Deferred  ASSESSMENT: 1. Type V hypertriglyceridemia - recurrent pancreatitis 2. Type 2 diabetes 3. Psoriatic arthritis  PLAN: 1.   Mr. Tomei has a type V hypertriglyceridemia which is long-standing and associated with recurrent pancreatitis.  Recently his triglycerides of 1307 are "the best number he seen in years".  He has made dietary changes and he was on metformin however had side effects with this.  His blood sugars are now elevated but not on therapy.  I would like to repeat a lipid profile since his last study was about 3 months ago.  In addition we will get a direct LDL.  If his LDL remains above goal he is a good candidate for an increase in his statin to high intensity 20 or 40 mg of rosuvastatin.  This will additionally help his triglycerides.  He also may be a candidate for the addition of a bile acid sequestrant.  This could help with his blood sugars as well as his triglycerides.  The goal is to of course try to get his triglycerides below 500 to reduce further risk of recurrent  pancreatitis.  Thanks again for the kind referral.  Plan to see him back in about 3 months.  Chrystie Nose, MD, [facility], FACP  [place]  [facility]  Medical Director of the Advanced Lipid Disorders &  Cardiovascular Risk Reduction Clinic Diplomate of the American Board of Clinical Lipidology Attending Cardiologist  Direct Dial: [phone]  Fax: [phone]  Website:  [email or web address]  03/27/2018, 1:35 PM

## 2018-04-02 DIAGNOSIS — E781 Pure hyperglyceridemia: Secondary | ICD-10-CM | POA: Diagnosis not present

## 2018-04-02 DIAGNOSIS — E119 Type 2 diabetes mellitus without complications: Secondary | ICD-10-CM | POA: Diagnosis not present

## 2018-04-03 LAB — LIPID PANEL
CHOL/HDL RATIO: 19.7 ratio — AB (ref 0.0–5.0)
Cholesterol, Total: 335 mg/dL — ABNORMAL HIGH (ref 100–199)
HDL: 17 mg/dL — AB (ref >39)
Triglycerides: 2033 mg/dL (ref 0–149)

## 2018-04-03 LAB — HEMOGLOBIN A1C
Est. average glucose Bld gHb Est-mCnc: 189 mg/dL
Hgb A1c MFr Bld: 8.2 % — ABNORMAL HIGH (ref 4.8–5.6)

## 2018-04-03 LAB — LDL CHOLESTEROL, DIRECT: LDL DIRECT: 26 mg/dL (ref 0–99)

## 2018-04-15 ENCOUNTER — Ambulatory Visit (INDEPENDENT_AMBULATORY_CARE_PROVIDER_SITE_OTHER): Payer: BLUE CROSS/BLUE SHIELD | Admitting: Internal Medicine

## 2018-04-15 ENCOUNTER — Telehealth: Payer: Self-pay | Admitting: Internal Medicine

## 2018-04-15 ENCOUNTER — Encounter: Payer: Self-pay | Admitting: Internal Medicine

## 2018-04-15 VITALS — BP 120/82 | HR 80 | Ht 65.0 in | Wt 178.4 lb

## 2018-04-15 DIAGNOSIS — K859 Acute pancreatitis without necrosis or infection, unspecified: Secondary | ICD-10-CM

## 2018-04-15 DIAGNOSIS — E781 Pure hyperglyceridemia: Secondary | ICD-10-CM

## 2018-04-15 DIAGNOSIS — E119 Type 2 diabetes mellitus without complications: Secondary | ICD-10-CM

## 2018-04-15 MED ORDER — ICOSAPENT ETHYL 1 G PO CAPS: 2.0000 g | ORAL_CAPSULE | Freq: Two times a day (BID) | ORAL | 11 refills | Status: DC

## 2018-04-15 MED ORDER — PIOGLITAZONE HCL 30 MG PO TABS: 30.0000 mg | ORAL_TABLET | Freq: Every day | ORAL | 3 refills | Status: DC

## 2018-04-15 NOTE — Telephone Encounter (Signed)
Approved today  Effective from 04/15/2018 through 04/13/2021.

## 2018-04-15 NOTE — Addendum Note (Signed)
Addended by: Lindell SparELKINS, Casi Westerfeld M on: 04/15/2018 08:54 AM   Modules accepted: Orders

## 2018-04-15 NOTE — Progress Notes (Signed)
LIPID CLINIC CONSULT NOTE  Chief Complaint:  High triglycerides  Primary Care Physician: Tally Joe, MD  HPI:  Marcus Mitchell is a 47 y.o. male who is being seen today for the evaluation of high triglyceride at the request of Tally Joe, MD.  This is a very pleasant 47 year old male with a history of high triglycerides for more than 20 years.  He has had very difficult time controlling them and this is led partially toward pancreatitis which is been recurrent.  He is followed by Dr. Bosie Clos and only recently has had some improvement in his pancreatic attacks.  He was on metformin for diabetes and recently came off of that due to side effects and his hemoglobin A1c remains elevated over 7.  In addition he has risk factors including hypertension and a family history of hypertension and dyslipidemia.  Is unclear whether or triglyceride elevations run in his family but have been noted to be as high as in the 4000s in the past.  One point he was referred to see Dr. Tenny Craw at [facility] in the lipid clinic and triglycerides were markedly elevated then.  Most recently though he has had some improvement in his numbers and as of August 2019, his total cholesterol was 204 triglycerides were 1307.  He has been managed on long-standing Lovaza 2 g twice daily as well as rosuvastatin 10 mg daily.  In addition he has psoriasis and psoriatic arthritis on Humira.  He has no known history of coronary artery disease.  04/15/2018  [name] returns today for follow-up.  Unfortunately repeat labs recently have shown a marked increase in triglycerides to 2033, with total cholesterol 335 and direct LDL of 86.  He tells me that in the interim he came off of metformin due to intolerance of the medication.  This is important however because he had significant GI side effects which have completely cleared up.  IMA globin A1c not surprisingly is gone back up to 8.2.  He is on Lovaza.  We discussed management options  including continuing his fenofibrate.  I think he is a good candidate to add Actos.  This should help with his blood sugars as well as better triglyceride lowering the metformin.  In addition there is clear evidence that Vascepa is a preferred omega-3 compared to Lovaza and we will go ahead and switch him to that.   PMHx:  Past Medical History:  Diagnosis Date  . Chronic recurrent pancreatitis (HCC) 08/05/2014  . Diabetes mellitus type II, controlled (HCC)   . GERD (gastroesophageal reflux disease)   . Hyperlipemia   . Hypertension   . Hypertriglyceridemia   . Hypertriglyceridemia, familial 08/05/2014  . Kidney stones   . NAFLD (nonalcoholic fatty liver disease)   . Pancreatitis, recurrent   . Plaque psoriasis   . Psoriatic arthritis [facility])     Past Surgical History:  Procedure Laterality Date  . CHOLECYSTECTOMY OPEN    . DUODENITIS    . ESOPHAGOGASTRODUODENOSCOPY    . EUS N/A 07/20/2015   Procedure: UPPER ENDOSCOPIC ULTRASOUND (EUS) RADIAL;  Surgeon: Willis Modena, MD;  Location: [facility];  Service: Endoscopy;  Laterality: N/A;    FAMHx:  Family History  Problem Relation Age of Onset  . Hypertension Mother   . Diabetes Mother   . Hyperlipidemia Father     SOCHx:   reports that he has quit smoking. His smoking use included cigarettes. He has never used smokeless tobacco. He reports that he does not drink alcohol  or use drugs.  ALLERGIES:  Allergies  Allergen Reactions  . Nsaids Hives  . Niacin And Related Other (See Comments)    flushing of face    ROS: Pertinent items noted in HPI and remainder of comprehensive ROS otherwise negative.  HOME MEDS: Current Outpatient Medications on File Prior to Visit  Medication Sig Dispense Refill  . fenofibrate micronized (LOFIBRA) 134 MG capsule Take 134 mg by mouth daily. daily.    [name]. HUMIRA PEN 40 MG/0.8ML PNKT Inject 40 mg into the skin every 14 (fourteen) days.   2  . loratadine (CLARITIN) 10 MG tablet Take 10 mg by mouth  daily.    [name]. losartan (COZAAR) 25 MG tablet Take 25 mg by mouth daily before breakfast.    . omega-3 acid ethyl esters (LOVAZA) 1 G capsule Take 2 capsules (2 g total) by mouth 2 (two) times daily. 120 capsule 6  . omeprazole (PRILOSEC OTC) 20 MG tablet Take 20 mg by mouth daily.     . rosuvastatin (CRESTOR) 10 MG tablet Take 1 tablet (10 mg total) by mouth daily. 30 tablet 5   No current facility-administered medications on file prior to visit.     LABS/IMAGING: No results found for this or any previous visit (from the past 48 hour(s)). No results found.  LIPID PANEL:    Component Value Date/Time   CHOL 335 (H) 04/02/2018 0802   TRIG 2,033 (HH) 04/02/2018 0802   HDL 17 (L) 04/02/2018 0802   CHOLHDL 19.7 (H) 04/02/2018 0802   CHOLHDL 7 10/12/2014 0833   LDLCALC Comment 04/02/2018 0802   LDLDIRECT 26 04/02/2018 0802   LDLDIRECT 31.0 10/12/2014 0833    WEIGHTS: Wt Readings from Last 3 Encounters:  04/15/18 178 lb 6.4 oz (80.9 kg)  03/27/18 179 lb 6.4 oz (81.4 kg)  11/14/15 182 lb (82.6 kg)    VITALS: BP 120/82   Pulse 80   Ht 5\' 5"  (1.651 m)   Wt 178 lb 6.4 oz (80.9 kg)   BMI 29.69 kg/m   EXAM: Deferred  EKG: Deferred  ASSESSMENT: 1. Type V hypertriglyceridemia - recurrent pancreatitis 2. Type 2 diabetes 3. Psoriatic arthritis  PLAN: 1.   Mr. Sanda Kleinsquivel has persistent elevated triglycerides which are much higher, mostly after coming off of metformin.  He is been intolerant to this.  I think is a good candidate for Actos.  There is no history of heart failure or edema.  This should help both with blood sugars and triglycerides.  We will also change his Lovaza to Vascepa and continue fenofibrate.  Plan repeat lipid profile and direct LDL fasting in 2 to 3 months as well as hemoglobin A1c.  He tells me he is referred to see an endocrinologist in January.  Chrystie NoseKenneth C. Rose Hegner, MD, [facility] CenterFACC, FACP  [place]  [facility]  Medical Director of the Advanced Lipid Disorders &   Cardiovascular Risk Reduction Clinic Diplomate of the American Board of Clinical Lipidology Attending Cardiologist  Direct Dial: [phone]  Fax: [phone]  Website:  [email or web address]  Shabrea Weldin C Aryana Wonnacott 04/15/2018, 8:36 AM

## 2018-04-15 NOTE — Patient Instructions (Signed)
Medication Instructions:  START Actos 30mg  daily START Vascepa 2gram (2 capsules) twice daily STOP Lovaza If you need a refill on your cardiac medications before your next appointment, please call your pharmacy.   Lab work: FASTING lab work 1 week prior to next visit - lipid panel, direct LDL, A1c If you have labs (blood work) drawn today and your tests are completely normal, you will receive your results only by: [name]. MyChart Message (if you have MyChart) OR . A paper copy in the mail If you have any lab test that is abnormal or we need to change your treatment, we will call you to review the results.  Testing/Procedures: NONE  Follow-Up: as planned in February 2020

## 2018-04-15 NOTE — Telephone Encounter (Signed)
PA for vascepa submitted via [email or web address]  Key: [id number] - Rx #: [id number]

## 2018-04-22 NOTE — Telephone Encounter (Signed)
LM for pharmacy that med has been approved

## 2018-06-23 DIAGNOSIS — J069 Acute upper respiratory infection, unspecified: Secondary | ICD-10-CM | POA: Diagnosis not present

## 2018-06-27 DIAGNOSIS — E119 Type 2 diabetes mellitus without complications: Secondary | ICD-10-CM | POA: Diagnosis not present

## 2018-06-27 DIAGNOSIS — E781 Pure hyperglyceridemia: Secondary | ICD-10-CM | POA: Diagnosis not present

## 2018-06-28 LAB — HEMOGLOBIN A1C
Est. average glucose Bld gHb Est-mCnc: 197 mg/dL
Hgb A1c MFr Bld: 8.5 % — ABNORMAL HIGH (ref 4.8–5.6)

## 2018-06-28 LAB — LIPID PANEL
CHOL/HDL RATIO: 8.1 ratio — AB (ref 0.0–5.0)
Cholesterol, Total: 211 mg/dL — ABNORMAL HIGH (ref 100–199)
HDL: 26 mg/dL — AB (ref >39)
TRIGLYCERIDES: 675 mg/dL — AB (ref 0–149)

## 2018-06-28 LAB — LDL CHOLESTEROL, DIRECT: LDL DIRECT: 62 mg/dL (ref 0–99)

## 2018-06-30 ENCOUNTER — Telehealth: Payer: Self-pay

## 2018-06-30 NOTE — Telephone Encounter (Signed)
-----   Message from Chrystie Nose, MD sent at 06/28/2018 11:04 AM EST ----- Triglycerides significantly reduced - LDL at goal. HBA1C still high.  Dr. Rexene Edison

## 2018-06-30 NOTE — Telephone Encounter (Signed)
Called to give pt lab results. Pt voicemail states his name. lmom with results, pt is to call the office if any questions.

## 2018-07-07 ENCOUNTER — Telehealth: Payer: Self-pay | Admitting: Internal Medicine

## 2018-07-07 NOTE — Telephone Encounter (Signed)
Left message to call back  

## 2018-07-07 NOTE — Telephone Encounter (Signed)
Spoke with patient and he was wanting to know if needed appointment tomorrow secondary to high copay and receiving lab results. Advised to keep visit tomorrow since first follow up visit on new medication and if no changes maybe Dr Rennis Golden will push visits out further

## 2018-07-07 NOTE — Telephone Encounter (Signed)
  Patient states he had blood work not long ago and is questioning if he needs to be seen tomorrow or not. He has appt with Dr Rennis Golden on 07/08/18

## 2018-07-08 ENCOUNTER — Ambulatory Visit: Payer: BLUE CROSS/BLUE SHIELD | Admitting: Internal Medicine

## 2018-07-08 ENCOUNTER — Encounter: Payer: Self-pay | Admitting: Internal Medicine

## 2018-07-08 VITALS — BP 114/84 | HR 90 | Ht 65.0 in | Wt 182.8 lb

## 2018-07-08 DIAGNOSIS — E119 Type 2 diabetes mellitus without complications: Secondary | ICD-10-CM

## 2018-07-08 DIAGNOSIS — K859 Acute pancreatitis without necrosis or infection, unspecified: Secondary | ICD-10-CM | POA: Diagnosis not present

## 2018-07-08 DIAGNOSIS — E781 Pure hyperglyceridemia: Secondary | ICD-10-CM | POA: Diagnosis not present

## 2018-07-08 NOTE — Patient Instructions (Signed)
Your physician recommends that you continue on your current medications as directed. Please refer to the Current Medication list given to you today.  Dr.Hilty recommends that you return for a FASTING lipid profile and direct LDL  in 6 months (prior to your appointement)  Dr. Rennis Golden recommends that you schedule a follow up visit with him the in the LIPID CLINIC in 6 months. Please have fasting blood work about 1 week prior to this visit and he will review the blood work results with you at your appointment.

## 2018-07-08 NOTE — Progress Notes (Signed)
LIPID CLINIC CONSULT NOTE  Chief Complaint:  High triglycerides  Primary Care Physician: Tally Joe, MD  HPI:  Marcus Mitchell is a 48 y.o. male who is being seen today for the evaluation of high triglyceride at the request of Tally Joe, MD.  This is a very pleasant 48 year old male with a history of high triglycerides for more than 20 years.  He has had very difficult time controlling them and this is led partially toward pancreatitis which is been recurrent.  He is followed by Dr. Bosie Clos and only recently has had some improvement in his pancreatic attacks.  He was on metformin for diabetes and recently came off of that due to side effects and his hemoglobin A1c remains elevated over 7.  In addition he has risk factors including hypertension and a family history of hypertension and dyslipidemia.  Is unclear whether or triglyceride elevations run in his family but have been noted to be as high as in the 4000s in the past.  One point he was referred to see Dr. Tenny Craw at [facility] in the lipid clinic and triglycerides were markedly elevated then.  Most recently though he has had some improvement in his numbers and as of August 2019, his total cholesterol was 204 triglycerides were 1307.  He has been managed on long-standing Lovaza 2 g twice daily as well as rosuvastatin 10 mg daily.  In addition he has psoriasis and psoriatic arthritis on Humira.  He has no known history of coronary artery disease.  04/15/2018  [name] returns today for follow-up.  Unfortunately repeat labs recently have shown a marked increase in triglycerides to 2033, with total cholesterol 335 and direct LDL of 86.  He tells me that in the interim he came off of metformin due to intolerance of the medication.  This is important however because he had significant GI side effects which have completely cleared up.  IMA globin A1c not surprisingly is gone back up to 8.2.  He is on Lovaza.  We discussed management options  including continuing his fenofibrate.  I think he is a good candidate to add Actos.  This should help with his blood sugars as well as better triglyceride lowering the metformin.  In addition there is clear evidence that Vascepa is a preferred omega-3 compared to Lovaza and we will go ahead and switch him to that.  07/10/2018  [name] returns today for follow-up.  He is doing very well on Vascepa.  He recently had a marked improvement in his dyslipidemia.  His total cholesterol now is 211, triglycerides of 678 (reduced from 2033), HDL 26 and direct LDL of 62.  He denies any side effects of the medications.  He continues on fenofibrate as well.  He is also on Actos and rosuvastatin 10 mg daily.  PMHx:  Past Medical History:  Diagnosis Date  . Chronic recurrent pancreatitis (HCC) 08/05/2014  . Diabetes mellitus type II, controlled (HCC)   . GERD (gastroesophageal reflux disease)   . Hyperlipemia   . Hypertension   . Hypertriglyceridemia   . Hypertriglyceridemia, familial 08/05/2014  . Kidney stones   . NAFLD (nonalcoholic fatty liver disease)   . Pancreatitis, recurrent   . Plaque psoriasis   . Psoriatic arthritis [facility])     Past Surgical History:  Procedure Laterality Date  . CHOLECYSTECTOMY OPEN    . DUODENITIS    . ESOPHAGOGASTRODUODENOSCOPY    . EUS N/A 07/20/2015   Procedure: UPPER ENDOSCOPIC ULTRASOUND (EUS) RADIAL;  Surgeon: Willis Modena, MD;  Location: [facility];  Service: Endoscopy;  Laterality: N/A;    FAMHx:  Family History  Problem Relation Age of Onset  . Hypertension Mother   . Diabetes Mother   . Hyperlipidemia Father     SOCHx:   reports that he has quit smoking. His smoking use included cigarettes. He has never used smokeless tobacco. He reports that he does not drink alcohol or use drugs.  ALLERGIES:  Allergies  Allergen Reactions  . Nsaids Hives  . Niacin And Related Other (See Comments)    flushing of face    ROS: Pertinent items noted in HPI  and remainder of comprehensive ROS otherwise negative.  HOME MEDS: Current Outpatient Medications on File Prior to Visit  Medication Sig Dispense Refill  . fenofibrate micronized (LOFIBRA) 134 MG capsule Take 134 mg by mouth daily. daily.    [name] HUMIRA PEN 40 MG/0.8ML PNKT Inject 40 mg into the skin every 14 (fourteen) days.   2  . Icosapent Ethyl 1 g CAPS Take 2 capsules (2 g total) by mouth 2 (two) times daily. 120 capsule 11  . loratadine (CLARITIN) 10 MG tablet Take 10 mg by mouth daily.    [name] losartan (COZAAR) 25 MG tablet Take 25 mg by mouth daily before breakfast.    . omeprazole (PRILOSEC OTC) 20 MG tablet Take 20 mg by mouth daily.     . pioglitazone (ACTOS) 30 MG tablet Take 1 tablet (30 mg total) by mouth daily. 90 tablet 3  . rosuvastatin (CRESTOR) 10 MG tablet Take 1 tablet (10 mg total) by mouth daily. 30 tablet 5   No current facility-administered medications on file prior to visit.     LABS/IMAGING: No results found for this or any previous visit (from the past 48 hour(s)). No results found.  LIPID PANEL:    Component Value Date/Time   CHOL 211 (H) 06/27/2018 0747   TRIG 675 (HH) 06/27/2018 0747   HDL 26 (L) 06/27/2018 0747   CHOLHDL 8.1 (H) 06/27/2018 0747   CHOLHDL 7 10/12/2014 0833   LDLCALC Comment 06/27/2018 0747   LDLDIRECT 62 06/27/2018 0747   LDLDIRECT 31.0 10/12/2014 0833    WEIGHTS: Wt Readings from Last 3 Encounters:  07/08/18 182 lb 12.8 oz (82.9 kg)  04/15/18 178 lb 6.4 oz (80.9 kg)  03/27/18 179 lb 6.4 oz (81.4 kg)    VITALS: BP 114/84   Pulse 90   Ht 5\' 5"  (1.651 m)   Wt 182 lb 12.8 oz (82.9 kg)   SpO2 95%   BMI 30.42 kg/m   EXAM: Deferred  EKG: Deferred  ASSESSMENT: 1. Type V hypertriglyceridemia - recurrent pancreatitis 2. Type 2 diabetes 3. Psoriatic arthritis  PLAN: 1.   Mr. Mustapha has had significant reduction in his dyslipidemia now with triglycerides well below thousand.  I feel that his risk of pancreatitis is been  reduced and would recommend continuing on his current medications.  His hemoglobin A1c remains elevated at 8.5, however it is significantly improved as well.  Hopefully further reduction in this will help additionally with his triglycerides.  No changes were made to his medications today.  Plan follow-up in 6 months or sooner as necessary.  Chrystie Nose, MD, [facility], FACP  [place]  [facility] Heart[facility]  Medical Director of the Advanced Lipid Disorders &  Cardiovascular Risk Reduction Clinic Diplomate of the American Board of Clinical Lipidology Attending Cardiologist  Direct Dial: [phone]  Fax: [phone]  Website:  [email or web address]  Lisette AbuKenneth C Beuford Garcilazo 07/08/2018, 9:20 AM

## 2018-07-10 ENCOUNTER — Encounter: Payer: Self-pay | Admitting: Internal Medicine

## 2018-07-17 DIAGNOSIS — D696 Thrombocytopenia, unspecified: Secondary | ICD-10-CM | POA: Diagnosis not present

## 2018-07-17 DIAGNOSIS — I10 Essential (primary) hypertension: Secondary | ICD-10-CM | POA: Diagnosis not present

## 2018-07-17 DIAGNOSIS — E7800 Pure hypercholesterolemia, unspecified: Secondary | ICD-10-CM | POA: Diagnosis not present

## 2018-07-17 DIAGNOSIS — E1169 Type 2 diabetes mellitus with other specified complication: Secondary | ICD-10-CM | POA: Diagnosis not present

## 2018-07-25 DIAGNOSIS — J209 Acute bronchitis, unspecified: Secondary | ICD-10-CM | POA: Diagnosis not present

## 2018-07-25 DIAGNOSIS — R6889 Other general symptoms and signs: Secondary | ICD-10-CM | POA: Diagnosis not present

## 2018-11-06 ENCOUNTER — Telehealth: Payer: Self-pay | Admitting: Internal Medicine

## 2018-11-06 NOTE — Telephone Encounter (Signed)
Yes .. ok to take for 1 week.  DR. Lemmie Evens

## 2018-11-06 NOTE — Telephone Encounter (Signed)
Patient called and wanted to know if Dr. Debara Pickett would be ok with the patient taking a steroid regimen for the next week. The patient has psoriatic arthritis , and is having a flare up. The only thing that the Arthritis doctor can do is give the patient Medrol.  The patient is aware that this medicine can raise his triglycerides and blood sugar, so he just wants to clarify with Dr. Debara Pickett first.

## 2018-11-06 NOTE — Telephone Encounter (Signed)
Left detailed message on name-verified VM with MD advice

## 2018-11-06 NOTE — Telephone Encounter (Signed)
Sent to MD to review.

## 2019-01-05 DIAGNOSIS — L4050 Arthropathic psoriasis, unspecified: Secondary | ICD-10-CM | POA: Diagnosis not present

## 2019-01-05 DIAGNOSIS — Z79899 Other long term (current) drug therapy: Secondary | ICD-10-CM | POA: Diagnosis not present

## 2019-01-13 DIAGNOSIS — G5761 Lesion of plantar nerve, right lower limb: Secondary | ICD-10-CM | POA: Diagnosis not present

## 2019-01-13 DIAGNOSIS — M79671 Pain in right foot: Secondary | ICD-10-CM | POA: Diagnosis not present

## 2019-01-13 DIAGNOSIS — M67911 Unspecified disorder of synovium and tendon, right shoulder: Secondary | ICD-10-CM | POA: Diagnosis not present

## 2019-01-16 ENCOUNTER — Other Ambulatory Visit: Payer: Self-pay

## 2019-01-21 ENCOUNTER — Other Ambulatory Visit: Payer: Self-pay

## 2019-01-21 ENCOUNTER — Ambulatory Visit (INDEPENDENT_AMBULATORY_CARE_PROVIDER_SITE_OTHER): Payer: BLUE CROSS/BLUE SHIELD | Admitting: Internal Medicine

## 2019-01-21 ENCOUNTER — Encounter: Payer: Self-pay | Admitting: Internal Medicine

## 2019-01-21 VITALS — BP 118/68 | HR 102 | Temp 98.5°F | Ht 64.0 in | Wt 184.2 lb

## 2019-01-21 DIAGNOSIS — E781 Pure hyperglyceridemia: Secondary | ICD-10-CM

## 2019-01-21 DIAGNOSIS — E1165 Type 2 diabetes mellitus with hyperglycemia: Secondary | ICD-10-CM | POA: Diagnosis not present

## 2019-01-21 DIAGNOSIS — E119 Type 2 diabetes mellitus without complications: Secondary | ICD-10-CM | POA: Diagnosis not present

## 2019-01-21 LAB — BASIC METABOLIC PANEL
BUN: 16 mg/dL (ref 6–23)
CO2: 29 mEq/L (ref 19–32)
Calcium: 9.6 mg/dL (ref 8.4–10.5)
Chloride: 99 mEq/L (ref 96–112)
Creatinine, Ser: 1.03 mg/dL (ref 0.40–1.50)
GFR: 77.79 mL/min (ref >60.00)
Glucose, Bld: 215 mg/dL — ABNORMAL HIGH (ref 70–99)
Potassium: 4.3 mEq/L (ref 3.5–5.1)
Sodium: 136 mEq/L (ref 135–145)

## 2019-01-21 LAB — POCT GLYCOSYLATED HEMOGLOBIN (HGB A1C): Hemoglobin A1C: 8.2 % — AB (ref 4.0–5.6)

## 2019-01-21 MED ORDER — ONETOUCH VERIO VI STRP: ORAL_STRIP | 12 refills | Status: DC

## 2019-01-21 MED ORDER — FARXIGA 5 MG PO TABS: 5.0000 mg | ORAL_TABLET | Freq: Every day | ORAL | 6 refills | Status: DC

## 2019-01-21 NOTE — Progress Notes (Signed)
Name: Marcus Mitchell  MRN/ DOB: [id number], [birth year]   Age/ Sex: 48 y.o., male    PCP: Tally Joe, MD   Reason for Endocrinology Evaluation: Type 2 Diabetes Mellitus     Date of Initial Endocrinology Visit: 01/21/2019     PATIENT IDENTIFIER: Marcus Mitchell is a 48 y.o. male with a past medical history of T2DM , Hx of pancreatitis, Psoriatic arthritis  and Dyslipidemia. The patient presented for initial endocrinology clinic visit on 01/21/2019 for consultative assistance with his diabetes management.    HPI: Marcus Mitchell was    Diagnosed with DM Years ago  Prior Medications tried/Intolerance: Metformin - GI side effects  Currently checking blood sugars 0 x day  Hypoglycemia episodes : no               Hemoglobin A1c has ranged from 7.7% in 2019, peaking at 8.5% in 2020. Patient required assistance for hypoglycemia: no Patient has required hospitalization within the last 1 year from hyper or hypoglycemia: no  In terms of diet, the patient eats 2 meals a day, snacks a lot , avoids sugar-sweetened beverages   Mother with DM   No alcohol use    HOME DIABETES REGIMEN: Pioglitazone 30 mg daily  Statin: yes ACE-I/ARB: yes Prior Diabetic Education: yes    METER DOWNLOAD SUMMARY: Does not check    DIABETIC COMPLICATIONS: Microvascular complications:    Denies: retinopathy, CKD , neuropathy   Last eye exam: Completed 2019   Macrovascular complications:    Denies: CAD, PVD, CVA   PAST HISTORY: Past Medical History:  Past Medical History:  Diagnosis Date  . Chronic recurrent pancreatitis (HCC) 08/05/2014  . Diabetes mellitus type II, controlled (HCC)   . GERD (gastroesophageal reflux disease)   . Hyperlipemia   . Hypertension   . Hypertriglyceridemia   . Hypertriglyceridemia, familial 08/05/2014  . Kidney stones   . NAFLD (nonalcoholic fatty liver disease)   . Pancreatitis, recurrent   . Plaque psoriasis   . Psoriatic arthritis [facility])    Past  Surgical History:  Past Surgical History:  Procedure Laterality Date  . CHOLECYSTECTOMY OPEN    . DUODENITIS    . ESOPHAGOGASTRODUODENOSCOPY    . EUS N/A 07/20/2015   Procedure: UPPER ENDOSCOPIC ULTRASOUND (EUS) RADIAL;  Surgeon: Willis Modena, MD;  Location: [facility];  Service: Endoscopy;  Laterality: N/A;      Social History:  reports that he has quit smoking. His smoking use included cigarettes. He has never used smokeless tobacco. He reports that he does not drink alcohol or use drugs. Family History:  Family History  Problem Relation Age of Onset  . Hypertension Mother   . Diabetes Mother   . Hyperlipidemia Father      HOME MEDICATIONS: Allergies as of 01/21/2019      Reactions   Nsaids Hives   Niacin And Related Other (See Comments)   flushing of face      Medication List       Accurate as of January 21, 2019 10:44 AM. If you have any questions, ask your nurse or doctor.        cetirizine 10 MG tablet Commonly known as: ZYRTEC Take 10 mg by mouth daily.   fenofibrate micronized 134 MG capsule Commonly known as: LOFIBRA Take 134 mg by mouth daily. daily.   Humira Pen 40 MG/0.8ML Pnkt Generic drug: Adalimumab Inject 40 mg into the skin every 14 (fourteen) days.   Icosapent Ethyl 1 g Caps Take  2 capsules (2 g total) by mouth 2 (two) times daily.   loratadine 10 MG tablet Commonly known as: CLARITIN Take 10 mg by mouth daily.   losartan 25 MG tablet Commonly known as: COZAAR Take 25 mg by mouth daily before breakfast.   nabumetone 500 MG tablet Commonly known as: RELAFEN   omeprazole 20 MG tablet Commonly known as: PRILOSEC OTC Take 20 mg by mouth daily.   pioglitazone 30 MG tablet Commonly known as: ACTOS Take 1 tablet (30 mg total) by mouth daily.   rosuvastatin 10 MG tablet Commonly known as: CRESTOR Take 1 tablet (10 mg total) by mouth daily.        ALLERGIES: Allergies  Allergen Reactions  . Nsaids Hives  . Niacin And Related  Other (See Comments)    flushing of face     REVIEW OF SYSTEMS: A comprehensive ROS was conducted with the patient and is negative except as per HPI and below:  Review of Systems  Constitutional: Negative for chills and fever.  HENT: Negative for congestion and sore throat.   Eyes: Negative for blurred vision and pain.  Respiratory: Negative for cough and shortness of breath.   Cardiovascular: Negative for chest pain and palpitations.  Gastrointestinal: Negative for diarrhea and nausea.  Genitourinary: Positive for frequency.  Neurological: Negative for tingling and tremors.  Endo/Heme/Allergies: Negative for polydipsia.  Psychiatric/Behavioral: Negative for depression. The patient is not nervous/anxious.       OBJECTIVE:   VITAL SIGNS: BP 118/68 (BP Location: Left Arm, Patient Position: Sitting, Cuff Size: Normal)   Pulse (!) 102   Temp 98.5 F (36.9 C)   Ht 5\' 4"  (1.626 m)   Wt 184 lb 3.2 oz (83.6 kg)   SpO2 97%   BMI 31.62 kg/m    PHYSICAL EXAM:  General: Pt appears well and is in NAD  Hydration: Well-hydrated with moist mucous membranes and good skin turgor  HEENT: Head: Unremarkable with good dentition. Oropharynx clear without exudate.  Eyes: External eye exam normal without stare, lid lag or exophthalmos.  EOM intact.    Neck: General: Supple without adenopathy or carotid bruits. Thyroid: Thyroid size normal.  No goiter or nodules appreciated. No thyroid bruit.  Lungs: Clear with good BS bilat with no rales, rhonchi, or wheezes  Heart: RRR with normal S1 and S2 and no gallops; no murmurs; no rub  Abdomen: Normoactive bowel sounds, soft, nontender, without masses or organomegaly palpable  Extremities:  Lower extremities - No pretibial edema. No lesions.  Skin: Normal texture and temperature to palpation. No rash noted. No Acanthosis nigricans/skin tags. No lipohypertrophy.  Neuro: MS is good with appropriate affect, pt is alert and Ox3    DM foot exam: 01/21/19   The skin of the feet is intact without sores or ulcerations. The pedal pulses are 2+ on right and 2+ on left. The sensation is intact to a screening 5.07, 10 gram monofilament bilaterally    DATA REVIEWED:  Lab Results  Component Value Date   HGBA1C 8.2 (A) 01/21/2019   HGBA1C 8.5 (H) 06/27/2018   HGBA1C 8.2 (H) 04/02/2018     Lab Results  Component Value Date   CHOL 211 (H) 06/27/2018   HDL 26 (L) 06/27/2018   LDLCALC Comment 06/27/2018   LDLDIRECT 62 06/27/2018   TRIG 675 (HH) 06/27/2018   CHOLHDL 8.1 (H) 06/27/2018        BUn/Cr 17/1.02 GFR 79  ASSESSMENT / PLAN / RECOMMENDATIONS:   1) Type 2  Diabetes Mellitus, Suboptimally controlled, Without complications - Most recent A1c of 8.2 %. Goal A1c < 7.0 %.    Plan: GENERAL: I have discussed with the patient the pathophysiology of diabetes. We went over the natural progression of the disease. We talked about both insulin resistance and insulin deficiency. We stressed the importance of lifestyle changes including diet and exercise. I explained the complications associated with diabetes including retinopathy, nephropathy, neuropathy as well as increased risk of cardiovascular disease. We went over the benefit seen with glycemic control.    I explained to the patient that diabetic patients are at higher than normal risk for amputations.   He is limited in his intake due to low fat diet due to hypertriglyceridemia , I have offered a CDE referral but he would like to try first.   Discussed the importance of lifestyle changes in diabetes management, discussed limiting sugar-sweetened beverages and limiting snacks or consuming glow CHO snacks   He is intolerant to Metformin   He is not a candidate for GLP-1 agonists nor DPP-4 inhibitors due to risk of pancreatitis  We discussed options for add-on therapy such as sulfonylurea vs SGLT-2 inhibitors, we discussed benefit and risk of both, pt opted to try [place], discussed risk of  dehydration and genital infections. He understand that one genital infection is not an indication to stop the medicine, but this can be treated appropriately ,but if he gets a recurrent infection then will have to stop the medicine. I have encouraged water intake.   Discussed the importance of glucose checks at home, pt was provided with a meter today   MEDICATIONS:  Farxiga 5 mg daily   Continue Pioglitazone 30 mg daily   EDUCATION / INSTRUCTIONS:  BG monitoring instructions: Patient is instructed to check his blood sugars 2 times a day, fasting and bedtime .  Call [facility] if: BG persistently < 70 or > 300. . I reviewed the Rule of 15 for the treatment of hypoglycemia in detail with the patient. Literature supplied.   2) Diabetic complications:   Eye: Does nothave known diabetic retinopathy.   Neuro/ Feet: Does not have known diabetic peripheral neuropathy.  Renal: Patient does not have known baseline CKD. He is on an ACEI/ARB at present.  3) Dyslipidemia/Hypertriglyceridemia: Patient is on a Crestor 10 mg , vascepa and fenofibrate. We also discussed that cutting down on his CHO intake will improve his Tg levels.         Signed electronically by: Lyndle HerrlichAbby Jaralla Daniele Dillow, MD  [facility]  [facility] [address] [place], [place] [address] Phone: [phone] FAX: [phone]   CC: Tally JoeSwayne, David, MD [address] [place] [address] Phone: [phone]  Fax: [phone]    Return to Endocrinology clinic as below: Future Appointments  Date Time Provider Department Center  03/06/2019  8:45 AM Hilty, Lisette AbuKenneth C, MD [facility]

## 2019-01-21 NOTE — Patient Instructions (Addendum)
-   Start Farxiga 5 mg daily in the morning  - Continue Pioglitazone 30 mg daily   - Check sugar fasting and bedtime when you can     Choose healthy, lower carb lower calorie snacks: toss salad, cooked vegetables, cottage cheese, peanut butter, low fat cheese / string cheese, lower sodium deli meat, tuna salad or chicken salad      HOW TO TREAT LOW BLOOD SUGARS (Blood sugar LESS THAN 70 MG/DL)  Please follow the RULE OF 15 for the treatment of hypoglycemia treatment (when your (blood sugars are less than 70 mg/dL)    STEP 1: Take 15 grams of carbohydrates when your blood sugar is low, which includes:   3-4 GLUCOSE TABS  OR  3-4 OZ OF JUICE OR REGULAR SODA OR  ONE TUBE OF GLUCOSE GEL     STEP 2: RECHECK blood sugar in 15 MINUTES STEP 3: If your blood sugar is still low at the 15 minute recheck --> then, go back to STEP 1 and treat AGAIN with another 15 grams of carbohydrates.

## 2019-01-22 ENCOUNTER — Encounter: Payer: Self-pay | Admitting: Internal Medicine

## 2019-01-23 DIAGNOSIS — M25511 Pain in right shoulder: Secondary | ICD-10-CM | POA: Diagnosis not present

## 2019-01-23 DIAGNOSIS — K219 Gastro-esophageal reflux disease without esophagitis: Secondary | ICD-10-CM | POA: Diagnosis not present

## 2019-01-23 DIAGNOSIS — Z0000 Encounter for general adult medical examination without abnormal findings: Secondary | ICD-10-CM | POA: Diagnosis not present

## 2019-01-23 DIAGNOSIS — E7800 Pure hypercholesterolemia, unspecified: Secondary | ICD-10-CM | POA: Diagnosis not present

## 2019-01-23 DIAGNOSIS — I10 Essential (primary) hypertension: Secondary | ICD-10-CM | POA: Diagnosis not present

## 2019-01-27 ENCOUNTER — Other Ambulatory Visit: Payer: Self-pay

## 2019-01-27 ENCOUNTER — Telehealth: Payer: Self-pay | Admitting: Internal Medicine

## 2019-01-27 MED ORDER — CONTOUR NEXT MONITOR W/DEVICE KIT: 1.0000 | PACK | Freq: Every day | 0 refills | Status: AC

## 2019-01-27 MED ORDER — GLUCOSE BLOOD VI STRP: ORAL_STRIP | 12 refills | Status: DC

## 2019-01-27 MED ORDER — WALGREENS LANCETS MISC: 1.0000 | Freq: Two times a day (BID) | 6 refills | Status: DC

## 2019-01-27 NOTE — Telephone Encounter (Signed)
sent 

## 2019-01-27 NOTE — Telephone Encounter (Signed)
Patient ph# [phone] called re: The One Touch Verio Reflect Glucose Monitor is not working properly-keeps giving error message. Also the aforementioned Monitor and supplies are not covered by patient's insurance. Patient requests a RX for the following glucose monitor that is covered by patient's insurance: Contour Next Glucose Monitor System and supplies (test strips, etc) be sent to  [id number] - [place], [place] - [address] [phone] (Phone) [phone] (Fax)

## 2019-02-02 DIAGNOSIS — R6889 Other general symptoms and signs: Secondary | ICD-10-CM | POA: Diagnosis not present

## 2019-02-10 DIAGNOSIS — R509 Fever, unspecified: Secondary | ICD-10-CM | POA: Diagnosis not present

## 2019-02-10 DIAGNOSIS — Z883 Allergy status to other anti-infective agents status: Secondary | ICD-10-CM | POA: Diagnosis not present

## 2019-02-10 DIAGNOSIS — J1289 Other viral pneumonia: Secondary | ICD-10-CM | POA: Diagnosis not present

## 2019-02-10 DIAGNOSIS — R42 Dizziness and giddiness: Secondary | ICD-10-CM | POA: Diagnosis not present

## 2019-02-10 DIAGNOSIS — Z79899 Other long term (current) drug therapy: Secondary | ICD-10-CM | POA: Diagnosis not present

## 2019-02-10 DIAGNOSIS — I10 Essential (primary) hypertension: Secondary | ICD-10-CM | POA: Diagnosis not present

## 2019-02-10 DIAGNOSIS — D696 Thrombocytopenia, unspecified: Secondary | ICD-10-CM | POA: Diagnosis not present

## 2019-02-10 DIAGNOSIS — E1165 Type 2 diabetes mellitus with hyperglycemia: Secondary | ICD-10-CM | POA: Diagnosis not present

## 2019-02-10 DIAGNOSIS — U071 COVID-19: Secondary | ICD-10-CM | POA: Diagnosis not present

## 2019-02-10 DIAGNOSIS — L4050 Arthropathic psoriasis, unspecified: Secondary | ICD-10-CM | POA: Diagnosis not present

## 2019-02-10 DIAGNOSIS — R0902 Hypoxemia: Secondary | ICD-10-CM | POA: Diagnosis not present

## 2019-02-10 DIAGNOSIS — R0602 Shortness of breath: Secondary | ICD-10-CM | POA: Diagnosis not present

## 2019-02-10 DIAGNOSIS — R51 Headache: Secondary | ICD-10-CM | POA: Diagnosis not present

## 2019-02-10 DIAGNOSIS — Z608 Other problems related to social environment: Secondary | ICD-10-CM | POA: Diagnosis not present

## 2019-02-10 DIAGNOSIS — J9601 Acute respiratory failure with hypoxia: Secondary | ICD-10-CM | POA: Diagnosis not present

## 2019-02-10 DIAGNOSIS — Z9049 Acquired absence of other specified parts of digestive tract: Secondary | ICD-10-CM | POA: Diagnosis not present

## 2019-02-10 DIAGNOSIS — E781 Pure hyperglyceridemia: Secondary | ICD-10-CM | POA: Diagnosis not present

## 2019-02-24 DIAGNOSIS — M25571 Pain in right ankle and joints of right foot: Secondary | ICD-10-CM | POA: Diagnosis not present

## 2019-02-26 ENCOUNTER — Telehealth: Payer: Self-pay | Admitting: Internal Medicine

## 2019-02-26 NOTE — Telephone Encounter (Signed)
Spoke with patient concerning 03/06/2019 visit. This is a lipid clinic f/up. He plans to have labs on 03/02/2019. He agrees with virtual appointment - phone call.      Virtual Visit Pre-Appointment Phone Call  "(Name), I am calling you today to discuss your upcoming appointment. We are currently trying to limit exposure to the virus that causes COVID-19 by seeing patients at home rather than in the office."  1. "What is the BEST phone number to call the day of the visit?" - include this in appointment notes  2. Do you have or have access to (through a family member/friend) a smartphone with video capability that we can use for your visit?" a. If yes - list this number in appt notes as cell (if different from BEST phone #) and list the appointment type as a VIDEO visit in appointment notes b. If no - list the appointment type as a PHONE visit in appointment notes  3. Confirm consent - "In the setting of the current Covid19 crisis, you are scheduled for a (phone or video) visit with your provider on (date) at (time).  Just as we do with many in-office visits, in order for you to participate in this visit, we must obtain consent.  If you'd like, I can send this to your mychart (if signed up) or email for you to review.  Otherwise, I can obtain your verbal consent now.  All virtual visits are billed to your insurance company just like a normal visit would be.  By agreeing to a virtual visit, we'd like you to understand that the technology does not allow for your provider to perform an examination, and thus may limit your provider's ability to fully assess your condition. If your provider identifies any concerns that need to be evaluated in person, we will make arrangements to do so.  Finally, though the technology is pretty good, we cannot assure that it will always work on either your or our end, and in the setting of a video visit, we may have to convert it to a phone-only visit.  In either  situation, we cannot ensure that we have a secure connection.  Are you willing to proceed?" STAFF: Did the patient verbally acknowledge consent to telehealth visit? Document YES/NO here: YES  4. Advise patient to be prepared - "Two hours prior to your appointment, go ahead and check your blood pressure, pulse, oxygen saturation, and your weight (if you have the equipment to check those) and write them all down. When your visit starts, your provider will ask you for this information. If you have an Apple Watch or Kardia device, please plan to have heart rate information ready on the day of your appointment. Please have a pen and paper handy nearby the day of the visit as well."  5. Inform patient they will receive a phone call 15 minutes prior to their appointment time (may be from unknown caller ID) so they should be prepared to answer    TELEPHONE CALL NOTE  Marcus Mitchell has been deemed a candidate for a follow-up tele-health visit to limit community exposure during the Covid-19 pandemic. I spoke with the patient via phone to ensure availability of phone/video source, confirm preferred email & phone number, and discuss instructions and expectations.  I reminded Marcus Mitchell to be prepared with any vital sign and/or heart rhythm information that could potentially be obtained via home monitoring, at the time of his visit. I reminded Marcus Mitchell to  expect a phone call prior to his visit.  Marcus Spar, RN 02/26/2019 2:10 PM   FULL LENGTH CONSENT FOR TELE-HEALTH VISIT   I hereby voluntarily request, consent and authorize [facility] and its employed or contracted physicians, physician assistants, nurse practitioners or other licensed health care professionals (the Practitioner), to provide me with telemedicine health care services (the Services") as deemed necessary by the treating Practitioner. I acknowledge and consent to receive the Services by the Practitioner via telemedicine.  I understand that the telemedicine visit will involve communicating with the Practitioner through live audiovisual communication technology and the disclosure of certain medical information by electronic transmission. I acknowledge that I have been given the opportunity to request an in-person assessment or other available alternative prior to the telemedicine visit and am voluntarily participating in the telemedicine visit.  I understand that I have the right to withhold or withdraw my consent to the use of telemedicine in the course of my care at any time, without affecting my right to future care or treatment, and that the Practitioner or I may terminate the telemedicine visit at any time. I understand that I have the right to inspect all information obtained and/or recorded in the course of the telemedicine visit and may receive copies of available information for a reasonable fee.  I understand that some of the potential risks of receiving the Services via telemedicine include:   Delay or interruption in medical evaluation due to technological equipment failure or disruption;  Information transmitted may not be sufficient (e.g. poor resolution of images) to allow for appropriate medical decision making by the Practitioner; and/or   In rare instances, security protocols could fail, causing a breach of personal health information.  Furthermore, I acknowledge that it is my responsibility to provide information about my medical history, conditions and care that is complete and accurate to the best of my ability. I acknowledge that Practitioner's advice, recommendations, and/or decision may be based on factors not within their control, such as incomplete or inaccurate data provided by me or distortions of diagnostic images or specimens that may result from electronic transmissions. I understand that the practice of medicine is not an exact science and that Practitioner makes no warranties or guarantees  regarding treatment outcomes. I acknowledge that I will receive a copy of this consent concurrently upon execution via email to the email address I last provided but may also request a printed copy by calling the office of [facility].    I understand that my insurance will be billed for this visit.   I have read or had this consent read to me.  I understand the contents of this consent, which adequately explains the benefits and risks of the Services being provided via telemedicine.   I have been provided ample opportunity to ask questions regarding this consent and the Services and have had my questions answered to my satisfaction.  I give my informed consent for the services to be provided through the use of telemedicine in my medical care  By participating in this telemedicine visit I agree to the above.

## 2019-03-03 DIAGNOSIS — E781 Pure hyperglyceridemia: Secondary | ICD-10-CM | POA: Diagnosis not present

## 2019-03-04 LAB — LDL CHOLESTEROL, DIRECT: LDL Direct: 57 mg/dL (ref 0–99)

## 2019-03-04 LAB — LIPID PANEL
Chol/HDL Ratio: 12.2 ratio — ABNORMAL HIGH (ref 0.0–5.0)
Cholesterol, Total: 305 mg/dL — ABNORMAL HIGH (ref 100–199)
HDL: 25 mg/dL — ABNORMAL LOW (ref >39)
Triglycerides: 1391 mg/dL (ref 0–149)

## 2019-03-06 ENCOUNTER — Telehealth (INDEPENDENT_AMBULATORY_CARE_PROVIDER_SITE_OTHER): Payer: BLUE CROSS/BLUE SHIELD | Admitting: Internal Medicine

## 2019-03-06 ENCOUNTER — Encounter: Payer: Self-pay | Admitting: Internal Medicine

## 2019-03-06 VITALS — BP 117/66 | HR 90

## 2019-03-06 DIAGNOSIS — E119 Type 2 diabetes mellitus without complications: Secondary | ICD-10-CM

## 2019-03-06 DIAGNOSIS — E781 Pure hyperglyceridemia: Secondary | ICD-10-CM | POA: Diagnosis not present

## 2019-03-06 DIAGNOSIS — K861 Other chronic pancreatitis: Secondary | ICD-10-CM | POA: Diagnosis not present

## 2019-03-06 DIAGNOSIS — K859 Acute pancreatitis without necrosis or infection, unspecified: Secondary | ICD-10-CM

## 2019-03-06 NOTE — Progress Notes (Signed)
Virtual Visit via Video Note   This visit type was conducted due to national recommendations for restrictions regarding the COVID-19 Pandemic (e.g. social distancing) in an effort to limit this patient's exposure and mitigate transmission in our community.  Due to his co-morbid illnesses, this patient is at least at moderate risk for complications without adequate follow up.  This format is felt to be most appropriate for this patient at this time.  All issues noted in this document were discussed and addressed.  A limited physical exam was performed with this format.  Please refer to the patient's chart for his consent to telehealth for [facility].   Evaluation Performed:  Doxy.me video visit  Date:  03/06/2019   ID:  Marcus Mitchell, Marcus Mitchell [birth year], MRN [id number]  Patient Location:  [address]  [place] [address]  Provider location:   [address], [place], [place] [address]  PCP:  Antony Contras, MD  Cardiologist:  No primary care provider on file. Electrophysiologist:  None   Chief Complaint:  Follow-up dyslipidemia  History of Present Illness:    AJDIN Mitchell is a 48 y.o. male who presents via audio/video conferencing for a telehealth visit today.  Mr. Sarver is a pleasant 48 year old male I been following for type V hypertriglyceridemia and recurrent pancreatitis.  He also has type 2 diabetes and a history of psoriatic arthritis.  Unfortunately, he recently was hospitalized less than a month ago with COVID-19 infection.  Both he and his wife were hospitalized and had pneumonia.  He was treated with high-dose steroids, nebulizers and supportive care.  He did not require remdesivir.  He was subsequently discharged and had been recovering at home.  He reports he has been very slow to recover but does feel that he is getting better.  He also had a steroid taper that ended just a week ago.  Is consequential as he recently had repeat labs which showed a  total cholesterol 305, triglycerides were very high again at 1391, HDL 25.  Direct LDL however was 57.  Receiving steroids while hospitalized on 02/10/2019, however his labs were repeated and this demonstrated total cholesterol 136, LDL 59, triglycerides of 330 and HDL 26.  Overall marked improvement in his lipid profile and demonstrating significant improvement on his current medical regimen.  The patient does have symptoms concerning for COVID-19 infection (fever, chills, cough, or new SHORTNESS OF BREATH).  Patient is recovering from recent COVID-19 infection.   Prior CV studies:   The following studies were reviewed today:  Chart reviewed Lab work  PMHx:  Past Medical History:  Diagnosis Date  . Chronic recurrent pancreatitis ([place]) 08/05/2014  . Diabetes mellitus type II, controlled ([place])   . GERD (gastroesophageal reflux disease)   . Hyperlipemia   . Hypertension   . Hypertriglyceridemia   . Hypertriglyceridemia, familial 08/05/2014  . Kidney stones   . NAFLD (nonalcoholic fatty liver disease)   . Pancreatitis, recurrent   . Plaque psoriasis   . Psoriatic arthritis [facility])     Past Surgical History:  Procedure Laterality Date  . CHOLECYSTECTOMY OPEN    . DUODENITIS    . ESOPHAGOGASTRODUODENOSCOPY    . EUS N/A 07/20/2015   Procedure: UPPER ENDOSCOPIC ULTRASOUND (EUS) RADIAL;  Surgeon: Arta Silence, MD;  Location: [facility];  Service: Endoscopy;  Laterality: N/A;    FAMHx:  Family History  Problem Relation Age of Onset  . Hypertension Mother   . Diabetes Mother   . Hyperlipidemia  Father     SOCHx:   reports that he has quit smoking. His smoking use included cigarettes. He has never used smokeless tobacco. He reports that he does not drink alcohol or use drugs.  ALLERGIES:  Allergies  Allergen Reactions  . Nsaids Hives  . Niacin And Related Other (See Comments)    flushing of face    MEDS:  Current Meds  Medication Sig  . Blood Glucose Monitoring Suppl  (CONTOUR NEXT MONITOR) w/Device KIT 1 Device by Does not apply route daily.  . cetirizine (ZYRTEC) 10 MG tablet Take 10 mg by mouth daily.  . dapagliflozin propanediol (FARXIGA) 5 MG TABS tablet Take 5 mg by mouth daily before breakfast.  . fenofibrate micronized (LOFIBRA) 134 MG capsule Take 134 mg by mouth daily. daily.  [name] glucose blood test strip Use as instructed to test blood sugars 2 times daily E11.65  . HUMIRA PEN 40 MG/0.8ML PNKT Inject 40 mg into the skin every 14 (fourteen) days.   Vanessa Kick Ethyl 1 g CAPS Take 2 capsules (2 g total) by mouth 2 (two) times daily.  [name] losartan (COZAAR) 25 MG tablet Take 25 mg by mouth daily before breakfast.  . nabumetone (RELAFEN) 500 MG tablet Take 500 mg by mouth daily as needed.   [name] omeprazole (PRILOSEC OTC) 20 MG tablet Take 20 mg by mouth daily.   . pioglitazone (ACTOS) 30 MG tablet Take 1 tablet (30 mg total) by mouth daily.  . rosuvastatin (CRESTOR) 10 MG tablet Take 1 tablet (10 mg total) by mouth daily.  . [facility] Lancets MISC 1 Package by Does not apply route 2 (two) times daily.     ROS: Pertinent items noted in HPI and remainder of comprehensive ROS otherwise negative.  Labs/Other Tests and Data Reviewed:    Recent Labs: 01/21/2019: BUN 16; Creatinine, Ser 1.03; Potassium 4.3; Sodium 136   Recent Lipid Panel Lab Results  Component Value Date/Time   CHOL 305 (H) 03/03/2019 08:41 AM   TRIG 1,391 (HH) 03/03/2019 08:41 AM   HDL 25 (L) 03/03/2019 08:41 AM   CHOLHDL 12.2 (H) 03/03/2019 08:41 AM   CHOLHDL 7 10/12/2014 08:33 AM   LDLCALC Comment (A) 03/03/2019 08:41 AM   LDLDIRECT 57 03/03/2019 08:42 AM   LDLDIRECT 31.0 10/12/2014 08:33 AM    Wt Readings from Last 3 Encounters:  01/21/19 184 lb 3.2 oz (83.6 kg)  07/08/18 182 lb 12.8 oz (82.9 kg)  04/15/18 178 lb 6.4 oz (80.9 kg)     Exam:    Vital Signs:  BP 117/66   Pulse 90    General appearance: alert and no distress Lungs: No visual respiratory difficulty Abdomen:  Mildly obese Extremities: extremities normal, atraumatic, no cyanosis or edema Skin: Skin color, texture, turgor normal. No rashes or lesions Neurologic: Mental status: Alert, oriented, thought content appropriate Psych: Pleasant  ASSESSMENT & PLAN:    1. Type V hypertriglyceridemia - recurrent pancreatitis 2. Type 2 diabetes 3. Psoriatic arthritis 4. Recent COVID-19 infection  Mr. Reeder unfortunately had recent COVID-19 infection was hospitalized for pneumonia.  His wife is also in the hospital.  They are recovering but he reports has been slow.  He was given high-dose steroids and was weaned on that.  I suspect this is the etiology of his marked elevation in triglycerides.  Just prior to receiving the steroids however his triglycerides have come down to 330, which is significant response to treatment.  At this time I would advise continuing his current  treatments.  He will need more time to recover and we will plan to repeat lipid in about 6 months with follow-up at that time.  COVID-19 Education: The signs and symptoms of COVID-19 were discussed with the patient and how to seek care for testing (follow up with PCP or arrange E-visit).  The importance of social distancing was discussed today.  Patient Risk:   After full review of this patients clinical status, I feel that they are at least moderate risk at this time.  Time:   Today, I have spent 25 minutes with the patient with telehealth technology discussing dyslipidemia, elevated triglycerides, COVID-19 education.     Medication Adjustments/Labs and Tests Ordered: Current medicines are reviewed at length with the patient today.  Concerns regarding medicines are outlined above.   Tests Ordered: Orders Placed This Encounter  Procedures  . Lipid panel  . LDL cholesterol, direct    Medication Changes: No orders of the defined types were placed in this encounter.   Disposition:  in 6 month(s)  Pixie Casino, MD, [facility],  [place] Director of the Advanced Lipid Disorders &  Cardiovascular Risk Reduction Clinic Diplomate of the American Board of Clinical Lipidology Attending Cardiologist  Direct Dial: [phone]  Fax: [phone]  Website:  [email or web address]  Pixie Casino, MD  03/06/2019 9:55 AM

## 2019-03-06 NOTE — Patient Instructions (Signed)
Medication Instructions:  Your physician recommends that you continue on your current medications as directed. Please refer to the Current Medication list given to you today.  *If you need a refill on your cardiac medications before your next appointment, please call your pharmacy*  Lab Work: Your physician recommends that you return for lab work in 6 month to check cholesterol - prior to next appointment  Testing/Procedures: NONE  Follow-Up: Dr. Debara Pickett recommends that you schedule a follow up visit with him the in the [place] in 6 months. Please have fasting blood work about 1 week prior to this visit and he will review the blood work results with you at your appointment.

## 2019-03-27 ENCOUNTER — Other Ambulatory Visit: Payer: Self-pay

## 2019-03-27 ENCOUNTER — Ambulatory Visit (INDEPENDENT_AMBULATORY_CARE_PROVIDER_SITE_OTHER): Payer: BLUE CROSS/BLUE SHIELD | Admitting: Internal Medicine

## 2019-03-27 ENCOUNTER — Encounter: Payer: Self-pay | Admitting: Internal Medicine

## 2019-03-27 VITALS — BP 108/72 | HR 89 | Ht 64.0 in | Wt 178.2 lb

## 2019-03-27 DIAGNOSIS — E1165 Type 2 diabetes mellitus with hyperglycemia: Secondary | ICD-10-CM | POA: Diagnosis not present

## 2019-03-27 DIAGNOSIS — E119 Type 2 diabetes mellitus without complications: Secondary | ICD-10-CM

## 2019-03-27 LAB — MICROALBUMIN / CREATININE URINE RATIO
Creatinine,U: 47.1 mg/dL
Microalb Creat Ratio: 1.5 mg/g (ref 0.0–30.0)
Microalb, Ur: <0.7 mg/dL (ref 0.0–1.9)

## 2019-03-27 MED ORDER — FARXIGA 10 MG PO TABS: 10.0000 mg | ORAL_TABLET | Freq: Every day | ORAL | 3 refills | Status: DC

## 2019-03-27 MED ORDER — PIOGLITAZONE HCL 30 MG PO TABS
30.0000 mg | ORAL_TABLET | Freq: Every day | ORAL | 3 refills | Status: DC
Start: 2019-03-27 — End: 2019-04-17

## 2019-03-27 NOTE — Patient Instructions (Signed)
-   Increase farxiga to 10 mg daily  - Continue Pioglitazone 30 mg daily      HOW TO TREAT LOW BLOOD SUGARS (Blood sugar LESS THAN 70 MG/DL)  Please follow the RULE OF 15 for the treatment of hypoglycemia treatment (when your (blood sugars are less than 70 mg/dL)    STEP 1: Take 15 grams of carbohydrates when your blood sugar is low, which includes:   3-4 GLUCOSE TABS  OR  3-4 OZ OF JUICE OR REGULAR SODA OR  ONE TUBE OF GLUCOSE GEL     STEP 2: RECHECK blood sugar in 15 MINUTES STEP 3: If your blood sugar is still low at the 15 minute recheck --> then, go back to STEP 1 and treat AGAIN with another 15 grams of carbohydrates.

## 2019-03-27 NOTE — Progress Notes (Signed)
Name: Marcus Mitchell  Age/ Sex: 48 y.o., male   MRN/ DOB: [id number], 03/18/1971     PCP: Antony Contras, MD   Reason for Endocrinology Evaluation: Type 2 Diabetes Mellitus  Initial Endocrine Consultative Visit: 01/21/2019    PATIENT IDENTIFIER: [name] is a 48 y.o. male with a past medical history of T2DM , Hx of pancreatitis, Psoriatic arthritis  and Dyslipidemia. The patient has followed with Endocrinology clinic since 01/21/2019 for consultative assistance with management of his diabetes.  DIABETIC HISTORY:  Mr. Perin was diagnosed with T2DM many years ago,he has reported intolerance to Metformin. His hemoglobin A1c has ranged from 7.7% in 2019, peaking at 8.5% in 2020.   On his initial visit to our clinic he had an A1c of  8.2% ,And was on pioglitazone 30 mg daily , we started [place]   Mother with DM Denies ETOH use SUBJECTIVE:   During the last visit (01/21/2019): A1c 8.2 %. We added farxiga to pioglitazone   Today (03/29/2019): Mr. Curl is here for a 2 month follow up on diabetes management.  He checks his blood sugars 1 times daily, preprandial to breakfast. The patient has not had hypoglycemic episodes since the last clinic visit. Otherwise, the patient has not required any recent emergency interventions for hypoglycemia but had recent hospitalizations secondary to COVID-19 requiring steroids   ROS: As per HPI and as detailed below: Review of Systems  Respiratory: Negative for cough and shortness of breath.   Cardiovascular: Negative for chest pain and palpitations.      HOME DIABETES REGIMEN:  Pioglitazone 30 mg daily  Farxiga 5 mg daily      METER DOWNLOAD SUMMARY: Date range evaluated: 10/24-11/10/2018 Fingerstick Blood Glucose Tests = 14 Average Number Tests/Day = 1 Overall Mean FS Glucose = 175   BG Ranges: Low = 135 High = 267   Hypoglycemic Events/30 Days: BG < 50 = 0 Episodes of symptomatic severe hypoglycemia = 0     HISTORY:  Past Medical History:  Past Medical History:  Diagnosis Date  . Chronic recurrent pancreatitis ([place]) 08/05/2014  . Diabetes mellitus type II, controlled ([place])   . GERD (gastroesophageal reflux disease)   . Hyperlipemia   . Hypertension   . Hypertriglyceridemia   . Hypertriglyceridemia, familial 08/05/2014  . Kidney stones   . NAFLD (nonalcoholic fatty liver disease)   . Pancreatitis, recurrent   . Plaque psoriasis   . Psoriatic arthritis [facility])    Past Surgical History:  Past Surgical History:  Procedure Laterality Date  . CHOLECYSTECTOMY OPEN    . DUODENITIS    . ESOPHAGOGASTRODUODENOSCOPY    . EUS N/A 07/20/2015   Procedure: UPPER ENDOSCOPIC ULTRASOUND (EUS) RADIAL;  Surgeon: Arta Silence, MD;  Location: [facility];  Service: Endoscopy;  Laterality: N/A;    Social History:  reports that he has quit smoking. His smoking use included cigarettes. He has never used smokeless tobacco. He reports that he does not drink alcohol or use drugs. Family History:  Family History  Problem Relation Age of Onset  . Hypertension Mother   . Diabetes Mother   . Hyperlipidemia Father      HOME MEDICATIONS: Allergies as of 03/27/2019      Reactions   Nsaids Hives   Niacin And Related Other (See Comments)   flushing of face      Medication List       Accurate as of March 27, 2019 11:59 PM. If you have any questions, ask  your nurse or doctor.        STOP taking these medications   nabumetone 500 MG tablet Commonly known as: RELAFEN Stopped by: Dorita Sciara, MD     TAKE these medications   cetirizine 10 MG tablet Commonly known as: ZYRTEC Take 10 mg by mouth daily.   Contour Next Monitor w/Device Kit 1 Device by Does not apply route daily.   Farxiga 10 MG Tabs tablet Generic drug: dapagliflozin propanediol Take 10 mg by mouth daily before breakfast. What changed:   medication strength  how much to take Changed by: Dorita Sciara, MD    fenofibrate micronized 134 MG capsule Commonly known as: LOFIBRA Take 134 mg by mouth daily. daily.   glucose blood test strip Use as instructed to test blood sugars 2 times daily E11.65   Humira Pen 40 MG/0.8ML Pnkt Generic drug: Adalimumab Inject 40 mg into the skin every 14 (fourteen) days.   Icosapent Ethyl 1 g Caps Take 2 capsules (2 g total) by mouth 2 (two) times daily.   losartan 50 MG tablet Commonly known as: COZAAR Take 50 mg by mouth daily. What changed: Another medication with the same name was removed. Continue taking this medication, and follow the directions you see here. Changed by: Dorita Sciara, MD   omeprazole 20 MG tablet Commonly known as: PRILOSEC OTC Take 20 mg by mouth daily.   pioglitazone 30 MG tablet Commonly known as: ACTOS Take 1 tablet (30 mg total) by mouth daily.   rosuvastatin 10 MG tablet Commonly known as: CRESTOR Take 1 tablet (10 mg total) by mouth daily.   [facility] Lancets Misc 1 Package by Does not apply route 2 (two) times daily.        OBJECTIVE:   Vital Signs: BP 108/72   Pulse 89   Ht _0  (1.626 m)   Wt 178 lb 3.2 oz (80.8 kg)   SpO2 98%   BMI 30.59 kg/m   Wt Readings from Last 3 Encounters:  03/27/19 178 lb 3.2 oz (80.8 kg)  01/21/19 184 lb 3.2 oz (83.6 kg)  07/08/18 182 lb 12.8 oz (82.9 kg)     Exam: General: Pt appears well and is in NAD  Lungs: Clear with good BS bilat with no rales, rhonchi, or wheezes  Heart: RRR with normal S1 and S2 and no gallops; no murmurs; no rub  Abdomen: Normoactive bowel sounds, soft, nontender, without masses or organomegaly palpable  Extremities: No pretibial edema. No tremor..  Neuro: MS is good with appropriate affect, pt is alert and Ox3    DM foot exam: 01/21/19  The skin of the feet is intact without sores or ulcerations. The pedal pulses are 2+ on right and 2+ on left. The sensation is intact to a screening 5.07, 10 gram monofilament bilaterally      DATA REVIEWED:  Lab Results  Component Value Date   HGBA1C 8.2 (A) 01/21/2019   HGBA1C 8.5 (H) 06/27/2018   HGBA1C 8.2 (H) 04/02/2018   Lab Results  Component Value Date   MICROALBUR <0.7 03/27/2019   LDLCALC Comment (A) 03/03/2019   CREATININE 1.03 01/21/2019     Lab Results  Component Value Date   CHOL 305 (H) 03/03/2019   HDL 25 (L) 03/03/2019   LDLCALC Comment (A) 03/03/2019   LDLDIRECT 57 03/03/2019   TRIG 1,391 (HH) 03/03/2019   CHOLHDL 12.2 (H) 03/03/2019      Results for VADIM, CENTOLA (MRN [id number]) as of 03/29/2019  21:11  Ref. Range 03/27/2019 11:24  MICROALB/CREAT RATIO Latest Ref Range: 0.0 - 30.0 mg/g 1.5     ASSESSMENT / PLAN / RECOMMENDATIONS:   1) 1) Type 2 Diabetes Mellitus, Suboptimally controlled, Without complications - Most recent A1c of 8.2 %. Goal A1c < 7.0 %.      - In review of his meter download, his glycemic control has improved, he was also noted with weight loss.  - Encouraged to continue with lifestyle changes.  - No side effects to [place], will increase the dose as below  -He is intolerant to Metformin  - He is not a candidate for GLP-1 agonists nor DPP-4 inhibitors due to risk of pancreatitis - Up to date on microalbuminuria   MEDICATIONS: - Increase farxiga to 10 mg daily  - Continue Pioglitazone 30 mg daily   EDUCATION / INSTRUCTIONS:  BG monitoring instructions: Patient is instructed to check his blood sugars 2 times a day, fasting and bedtime  Call [place] [facility] clinic if: BG persistently < 70 or > 300. . I reviewed the Rule of 15 for the treatment of hypoglycemia in detail with the patient. Literature supplied.    F/U in 3 months    Signed electronically by: Mack Guise, MD  [facility]  [facility] [address] [place], [place] [address] Phone: [phone] FAX: [phone]   CC: Antony Contras, MD [address] [place] [address]  Phone: [phone]  Fax: [phone]  Return to Endocrinology clinic as below: Future Appointments  Date Time Provider [place]  07/01/2019  8:50 AM Shamleffer, Melanie Crazier, MD [id number] None

## 2019-03-30 ENCOUNTER — Encounter: Payer: Self-pay | Admitting: Internal Medicine

## 2019-04-17 ENCOUNTER — Other Ambulatory Visit: Payer: Self-pay | Admitting: Internal Medicine

## 2019-04-21 ENCOUNTER — Other Ambulatory Visit: Payer: Self-pay | Admitting: Internal Medicine

## 2019-06-29 ENCOUNTER — Other Ambulatory Visit: Payer: Self-pay

## 2019-06-30 NOTE — Progress Notes (Signed)
Name: Marcus Mitchell  Age/ Sex: 49 y.o., male   MRN/ DOB: [id number], 25-Jun-1970     PCP: Marcus Contras, MD   Reason for Endocrinology Evaluation: Type 2 Diabetes Mellitus  Initial Endocrine Consultative Visit: 01/21/2019    PATIENT IDENTIFIER: [name] is a 49 y.o. male with a past medical history of T2DM , Hx of pancreatitis, Psoriatic arthritis  and Dyslipidemia. The patient has followed with Endocrinology clinic since 01/21/2019 for consultative assistance with management of his diabetes.  DIABETIC HISTORY:  [name] was diagnosed with T2DM many years ago,he has reported intolerance to Metformin. His hemoglobin A1c has ranged from 7.7% in 2019, peaking at 8.5% in 2020.   On his initial visit to our clinic he had an A1c of  8.2% ,And was on pioglitazone 30 mg daily , we started [place]   Mother with DM Denies ETOH use SUBJECTIVE:   During the last visit (03/27/2019): A1c 8.2 %.Increased Farxiga and continued pioglitazone   Today (07/01/2019): [name] is here for a 3 month follow up on diabetes management.  He checks his blood sugars 1 times daily, preprandial to breakfast. The patient has not had hypoglycemic episodes since the last clinic visit. Otherwise, the patient has not required any recent emergency interventions for hypoglycemia or hyperglycemia.   ROS: As per HPI and as detailed below: Review of Systems  Respiratory: Negative for cough and shortness of breath.   Cardiovascular: Negative for chest pain and palpitations.      HOME DIABETES REGIMEN:  Pioglitazone 30 mg daily  Farxiga 10 mg daily      METER DOWNLOAD SUMMARY: Date range evaluated: 1/28-2/02/2020 Fingerstick Blood Glucose Tests = 10 Average Number Tests/Day = 0.7 Overall Mean FS Glucose = 167   BG Ranges: Low = 139 High = 220  Hypoglycemic Events/30 Days: BG < 50 = 0 Episodes of symptomatic severe hypoglycemia = 0    HISTORY:  Past Medical History:  Past Medical  History:  Diagnosis Date  . Chronic recurrent pancreatitis (Marcus Mitchell) 08/05/2014  . Diabetes mellitus type II, controlled (Marcus Mitchell)   . GERD (gastroesophageal reflux disease)   . Hyperlipemia   . Hypertension   . Hypertriglyceridemia   . Hypertriglyceridemia, familial 08/05/2014  . Kidney stones   . NAFLD (nonalcoholic fatty liver disease)   . Pancreatitis, recurrent   . Plaque psoriasis   . Psoriatic arthritis Marcus Mitchell)    Past Surgical History:  Past Surgical History:  Procedure Laterality Date  . CHOLECYSTECTOMY OPEN    . DUODENITIS    . ESOPHAGOGASTRODUODENOSCOPY    . EUS N/A 07/20/2015   Procedure: UPPER ENDOSCOPIC ULTRASOUND (EUS) RADIAL;  Surgeon: Marcus Silence, MD;  Location: [facility];  Service: Endoscopy;  Laterality: N/A;    Social History:  reports that he has quit smoking. His smoking use included cigarettes. He has never used smokeless tobacco. He reports that he does not drink alcohol or use drugs. Family History:  Family History  Problem Relation Age of Onset  . Hypertension Mother   . Diabetes Mother   . Hyperlipidemia Father      HOME MEDICATIONS: Allergies as of 07/01/2019      Reactions   Nsaids Hives   Niacin And Related Other (See Comments)   flushing of face      Medication List       Accurate as of July 01, 2019  8:58 AM. If you have any questions, ask your nurse or doctor.  cetirizine 10 MG tablet Commonly known as: ZYRTEC Take 10 mg by mouth daily.   Contour Next Monitor w/Device Kit 1 Device by Does not apply route daily.   Farxiga 10 MG Tabs tablet Generic drug: dapagliflozin propanediol Take 10 mg by mouth daily before breakfast.   fenofibrate micronized 134 MG capsule Commonly known as: LOFIBRA Take 134 mg by mouth daily. daily.   glucose blood test strip Use as instructed to test blood sugars 2 times daily E11.65   Humira Pen 40 MG/0.8ML Pnkt Generic drug: Adalimumab Inject 40 mg into the skin every 14 (fourteen) days.    losartan 50 MG tablet Commonly known as: COZAAR Take 50 mg by mouth daily.   omeprazole 20 MG tablet Commonly known as: PRILOSEC OTC Take 20 mg by mouth daily.   pioglitazone 30 MG tablet Commonly known as: ACTOS TAKE 1 TABLET(30 MG) BY MOUTH DAILY   rosuvastatin 10 MG tablet Commonly known as: CRESTOR Take 1 tablet (10 mg total) by mouth daily.   Vascepa 1 g capsule Generic drug: icosapent Ethyl TAKE 2 CAPSULES(2 GRAMS) BY MOUTH TWICE DAILY   [facility] Lancets Misc 1 Package by Does not apply route 2 (two) times daily.        OBJECTIVE:   Vital Signs: BP 118/82 (BP Location: Left Arm, Patient Position: Sitting, Cuff Size: Normal)   Pulse 91   Temp 98.1 F (36.7 C)   Ht _0  (1.626 m)   Wt 181 lb 9.6 oz (82.4 kg)   SpO2 97%   BMI 31.17 kg/m   Wt Readings from Last 3 Encounters:  07/01/19 181 lb 9.6 oz (82.4 kg)  03/27/19 178 lb 3.2 oz (80.8 kg)  01/21/19 184 lb 3.2 oz (83.6 kg)     Exam: General: Pt appears well and is in NAD  Lungs: Clear with good BS bilat with no rales, rhonchi, or wheezes  Heart: RRR with normal S1 and S2 and no gallops; no murmurs; no rub  Abdomen: Normoactive bowel sounds, soft, nontender, without masses or organomegaly palpable  Extremities: No pretibial edema.  Neuro: MS is good with appropriate affect, pt is alert and Ox3    DM foot exam: 07/01/2019  The skin of the feet is intact without sores or ulcerations. The pedal pulses are 2+ on right and 2+ on left. The sensation is intact to a screening 5.07, 10 gram monofilament bilaterally     DATA REVIEWED:  Lab Results  Component Value Date   HGBA1C 7.5 (A) 07/01/2019   HGBA1C 8.2 (A) 01/21/2019   HGBA1C 8.5 (H) 06/27/2018   Lab Results  Component Value Date   MICROALBUR <0.7 03/27/2019   LDLCALC Comment (A) 03/03/2019   CREATININE 1.03 01/21/2019     Lab Results  Component Value Date   CHOL 305 (H) 03/03/2019   HDL 25 (L) 03/03/2019   LDLCALC Comment (A)  03/03/2019   LDLDIRECT 57 03/03/2019   TRIG 1,391 ([place]) 03/03/2019   CHOLHDL 12.2 (H) 03/03/2019        Results for HARU, Marcus Mitchell (MRN [id number]) as of 03/29/2019 21:11  Ref. Range 03/27/2019 11:24  MICROALB/CREAT RATIO Latest Ref Range: 0.0 - 30.0 mg/g 1.5     ASSESSMENT / PLAN / RECOMMENDATIONS:   1) 1) Type 2 Diabetes Mellitus, Suboptimally controlled, Without complications - Most recent A1c of 7.5 %. Goal A1c < 7.0 %.     - His A1c continues to improve gradually, but its moving a bit slow, we have only  been able to bring his A1c down by 1.0% in the past year. We discussed add-on therapy, as he is on max dose of farxiga, I am hesistant to  Increase pioglitazone at this time.  - We discussed adding Glipizide, but the pt is interested  In trying Metformin , as he recalls the higher doses causing GI  Side effects.  - Encouraged to continue with lifestyle changes.  - He is not a candidate for GLP-1 agonists nor DPP-4 inhibitors due to risk of pancreatitis - Up to date on microalbuminuria   MEDICATIONS: - Farxiga 10 mg daily  - Pioglitazone 30 mg daily  - Metformin 500 mg, BID - titration provided    EDUCATION / INSTRUCTIONS:  BG monitoring instructions: Patient is instructed to check his blood sugars 1 times a day, fasting  Call [place] [facility] clinic if: BG persistently < 70 or > 300. . I reviewed the Rule of 15 for the treatment of hypoglycemia in detail with the patient. Literature supplied.    F/Mitchell in 4 months    Signed electronically by: Mack Guise, MD  [facility]  [facility] [address] [place], [place] [address] Phone: [phone] FAX: [phone]   CC: Marcus Contras, MD [address] [place] [address] Phone: [phone]  Fax: [phone]  Return to Endocrinology clinic as below: No future appointments.

## 2019-07-01 ENCOUNTER — Encounter: Payer: Self-pay | Admitting: Internal Medicine

## 2019-07-01 ENCOUNTER — Other Ambulatory Visit: Payer: Self-pay

## 2019-07-01 ENCOUNTER — Ambulatory Visit: Payer: BC Managed Care – PPO | Admitting: Internal Medicine

## 2019-07-01 VITALS — BP 118/82 | HR 91 | Temp 98.1°F | Ht 64.0 in | Wt 181.6 lb

## 2019-07-01 DIAGNOSIS — E119 Type 2 diabetes mellitus without complications: Secondary | ICD-10-CM

## 2019-07-01 LAB — POCT GLYCOSYLATED HEMOGLOBIN (HGB A1C): Hemoglobin A1C: 7.5 % — AB (ref 4.0–5.6)

## 2019-07-01 MED ORDER — METFORMIN HCL 500 MG PO TABS: 500.0000 mg | ORAL_TABLET | Freq: Two times a day (BID) | ORAL | 6 refills | Status: DC

## 2019-07-01 NOTE — Patient Instructions (Signed)
-   Start Metformin 500 mg XR, 1 tablet with breakfast for 2 weeks, if no stomach issues, please start taking 1 tablet with Breakfast and 1 tablet with supper  - Continue farxiga 10 mg daily  - Continue Pioglitazone 30 mg daily      HOW TO TREAT LOW BLOOD SUGARS (Blood sugar LESS THAN 70 MG/DL)  Please follow the RULE OF 15 for the treatment of hypoglycemia treatment (when your (blood sugars are less than 70 mg/dL)    STEP 1: Take 15 grams of carbohydrates when your blood sugar is low, which includes:   3-4 GLUCOSE TABS  OR  3-4 OZ OF JUICE OR REGULAR SODA OR  ONE TUBE OF GLUCOSE GEL     STEP 2: RECHECK blood sugar in 15 MINUTES STEP 3: If your blood sugar is still low at the 15 minute recheck --> then, go back to STEP 1 and treat AGAIN with another 15 grams of carbohydrates.

## 2019-07-17 DIAGNOSIS — E781 Pure hyperglyceridemia: Secondary | ICD-10-CM | POA: Diagnosis not present

## 2019-07-18 LAB — LIPID PANEL
Chol/HDL Ratio: 6.4 ratio — ABNORMAL HIGH (ref 0.0–5.0)
Cholesterol, Total: 225 mg/dL — ABNORMAL HIGH (ref 100–199)
HDL: 35 mg/dL — ABNORMAL LOW (ref >39)
LDL Chol Calc (NIH): 116 mg/dL — ABNORMAL HIGH (ref 0–99)
Triglycerides: 424 mg/dL — ABNORMAL HIGH (ref 0–149)
VLDL Cholesterol Cal: 74 mg/dL — ABNORMAL HIGH (ref 5–40)

## 2019-07-18 LAB — LDL CHOLESTEROL, DIRECT: LDL Direct: 97 mg/dL (ref 0–99)

## 2019-07-20 ENCOUNTER — Other Ambulatory Visit: Payer: Self-pay | Admitting: *Deleted

## 2019-07-20 DIAGNOSIS — E781 Pure hyperglyceridemia: Secondary | ICD-10-CM

## 2019-07-20 MED ORDER — ROSUVASTATIN CALCIUM 20 MG PO TABS: 20.0000 mg | ORAL_TABLET | Freq: Every day | ORAL | 3 refills | Status: DC

## 2019-08-19 ENCOUNTER — Telehealth: Payer: Self-pay | Admitting: Internal Medicine

## 2019-08-19 NOTE — Telephone Encounter (Signed)
PA for vascepa submitted via C[employer] Request for prior auth has been approved

## 2019-09-21 ENCOUNTER — Other Ambulatory Visit: Payer: Self-pay | Admitting: *Deleted

## 2019-09-21 DIAGNOSIS — E781 Pure hyperglyceridemia: Secondary | ICD-10-CM

## 2019-11-04 ENCOUNTER — Ambulatory Visit: Payer: BC Managed Care – PPO | Admitting: Internal Medicine

## 2019-11-19 ENCOUNTER — Other Ambulatory Visit: Payer: Self-pay

## 2019-11-19 ENCOUNTER — Encounter: Payer: Self-pay | Admitting: Internal Medicine

## 2019-11-19 ENCOUNTER — Ambulatory Visit (INDEPENDENT_AMBULATORY_CARE_PROVIDER_SITE_OTHER): Payer: BLUE CROSS/BLUE SHIELD | Admitting: Internal Medicine

## 2019-11-19 VITALS — BP 120/84 | HR 75 | Ht 64.0 in | Wt 181.8 lb

## 2019-11-19 DIAGNOSIS — E119 Type 2 diabetes mellitus without complications: Secondary | ICD-10-CM | POA: Diagnosis not present

## 2019-11-19 LAB — POCT GLYCOSYLATED HEMOGLOBIN (HGB A1C): Hemoglobin A1C: 6.8 % — AB (ref 4.0–5.6)

## 2019-11-19 MED ORDER — DAPAGLIFLOZIN PROPANEDIOL 10 MG PO TABS: 10.0000 mg | ORAL_TABLET | Freq: Every day | ORAL | 3 refills | Status: DC

## 2019-11-19 MED ORDER — PIOGLITAZONE HCL 30 MG PO TABS: 30.0000 mg | ORAL_TABLET | Freq: Every day | ORAL | 2 refills | Status: DC

## 2019-11-19 MED ORDER — METFORMIN HCL 500 MG PO TABS: 1000.0000 mg | ORAL_TABLET | Freq: Every day | ORAL | 3 refills | Status: DC

## 2019-11-19 NOTE — Patient Instructions (Signed)
-   Try to increase  Metformin 500 mg XR, to 2  tablets daily  - Continue farxiga 10 mg daily  - Continue Pioglitazone 30 mg daily      HOW TO TREAT LOW BLOOD SUGARS (Blood sugar LESS THAN 70 MG/DL)  Please follow the RULE OF 15 for the treatment of hypoglycemia treatment (when your (blood sugars are less than 70 mg/dL)    STEP 1: Take 15 grams of carbohydrates when your blood sugar is low, which includes:   3-4 GLUCOSE TABS  OR  3-4 OZ OF JUICE OR REGULAR SODA OR  ONE TUBE OF GLUCOSE GEL     STEP 2: RECHECK blood sugar in 15 MINUTES STEP 3: If your blood sugar is still low at the 15 minute recheck --> then, go back to STEP 1 and treat AGAIN with another 15 grams of carbohydrates.

## 2019-11-19 NOTE — Progress Notes (Signed)
Name: Marcus Mitchell  Age/ Sex: 49 y.o., male   MRN/ DOB: [id number], 12-24-1970     PCP: Antony Contras, MD   Reason for Endocrinology Evaluation: Type 2 Diabetes Mellitus  Initial Endocrine Consultative Visit: 01/21/2019    PATIENT IDENTIFIER: [name] is a 49 y.o. male with a past medical history of T2DM , Hx of pancreatitis, Psoriatic arthritis  and Dyslipidemia. The patient has followed with Endocrinology clinic since 01/21/2019 for consultative assistance with management of his diabetes.  DIABETIC HISTORY:  [name] was diagnosed with T2DM many years ago,he has reported intolerance to Metformin. His hemoglobin A1c has ranged from 7.7% in 2019, peaking at 8.5% in 2020.   On his initial visit to our clinic he had an A1c of  8.2% ,And was on pioglitazone 30 mg daily , we started [place]  Metformin started 06/2019   Mother with DM Denies ETOH use SUBJECTIVE:   During the last visit (07/01/2019): A1c 7.5 %. Started metformin, continued  [place] and  pioglitazone   Today (11/19/2019): Mr. Latour is here for a 3 month follow up on diabetes management.  He checks his blood sugars 1 times daily, preprandial to breakfast. The patient has not had hypoglycemic episodes since the last clinic visit. Otherwise, the patient has not required any recent emergency interventions for hypoglycemia or hyperglycemia.   ROS: As per HPI and as detailed below: Review of Systems  Respiratory: Negative for cough and shortness of breath.   Cardiovascular: Negative for chest pain and palpitations.      HOME DIABETES REGIMEN:  Pioglitazone 30 mg daily  Farxiga 10 mg daily  Metformin 500 mg BID     METER DOWNLOAD SUMMARY: Date range evaluated: 6/2-11/19/2019 Fingerstick Blood Glucose Tests = 13 Average Number Tests/Day = 0.4 Overall Mean FS Glucose = 136   BG Ranges: Low = 124 High = 144  Hypoglycemic Events/30 Days: BG < 50 = 0 Episodes of symptomatic severe hypoglycemia =  0    HISTORY:  Past Medical History:  Past Medical History:  Diagnosis Date   Chronic recurrent pancreatitis ([place]) 08/05/2014   Diabetes mellitus type II, controlled ([place])    GERD (gastroesophageal reflux disease)    Hyperlipemia    Hypertension    Hypertriglyceridemia    Hypertriglyceridemia, familial 08/05/2014   Kidney stones    NAFLD (nonalcoholic fatty liver disease)    Pancreatitis, recurrent    Plaque psoriasis    Psoriatic arthritis ([place])    Past Surgical History:  Past Surgical History:  Procedure Laterality Date   CHOLECYSTECTOMY OPEN     DUODENITIS     ESOPHAGOGASTRODUODENOSCOPY     EUS N/A 07/20/2015   Procedure: UPPER ENDOSCOPIC ULTRASOUND (EUS) RADIAL;  Surgeon: Arta Silence, MD;  Location: [facility];  Service: Endoscopy;  Laterality: N/A;    Social History:  reports that he has quit smoking. His smoking use included cigarettes. He has never used smokeless tobacco. He reports that he does not drink alcohol and does not use drugs. Family History:  Family History  Problem Relation Age of Onset   Hypertension Mother    Diabetes Mother    Hyperlipidemia Father      HOME MEDICATIONS: Allergies as of 11/19/2019      Reactions   Nsaids Hives   Niacin And Related Other (See Comments)   flushing of face      Medication List       Accurate as of November 19, 2019  7:25 AM.  If you have any questions, ask your nurse or doctor.        cetirizine 10 MG tablet Commonly known as: ZYRTEC Take 10 mg by mouth daily.   Contour Next Monitor w/Device Kit 1 Device by Does not apply route daily.   Farxiga 10 MG Tabs tablet Generic drug: dapagliflozin propanediol Take 10 mg by mouth daily before breakfast.   fenofibrate micronized 134 MG capsule Commonly known as: LOFIBRA Take 134 mg by mouth daily. daily.   glucose blood test strip Use as instructed to test blood sugars 2 times daily E11.65   Humira Pen 40 MG/0.8ML Pnkt Generic drug:  Adalimumab Inject 40 mg into the skin every 14 (fourteen) days.   losartan 50 MG tablet Commonly known as: COZAAR Take 50 mg by mouth daily.   metFORMIN 500 MG tablet Commonly known as: GLUCOPHAGE Take 1 tablet (500 mg total) by mouth 2 (two) times daily with a meal.   omeprazole 20 MG tablet Commonly known as: PRILOSEC OTC Take 20 mg by mouth daily.   pioglitazone 30 MG tablet Commonly known as: ACTOS TAKE 1 TABLET(30 MG) BY MOUTH DAILY   rosuvastatin 20 MG tablet Commonly known as: CRESTOR Take 1 tablet (20 mg total) by mouth daily.   Vascepa 1 g capsule Generic drug: icosapent Ethyl TAKE 2 CAPSULES(2 GRAMS) BY MOUTH TWICE DAILY   [facility] Lancets Misc 1 Package by Does not apply route 2 (two) times daily.        OBJECTIVE:   Vital Signs: There were no vitals taken for this visit.  Wt Readings from Last 3 Encounters:  07/01/19 181 lb 9.6 oz (82.4 kg)  03/27/19 178 lb 3.2 oz (80.8 kg)  01/21/19 184 lb 3.2 oz (83.6 kg)     Exam: General: Pt appears well and is in NAD  Lungs: Clear with good BS bilat with no rales, rhonchi, or wheezes  Heart: RRR with normal S1 and S2 and no gallops; no murmurs; no rub  Abdomen: Normoactive bowel sounds, soft, nontender, without masses or organomegaly palpable  Extremities: No pretibial edema.  Neuro: MS is good with appropriate affect, pt is alert and Ox3    DM foot exam: 07/01/2019  The skin of the feet is intact without sores or ulcerations. The pedal pulses are 2+ on right and 2+ on left. The sensation is intact to a screening 5.07, 10 gram monofilament bilaterally     DATA REVIEWED:  Lab Results  Component Value Date   HGBA1C 7.5 (A) 07/01/2019   HGBA1C 8.2 (A) 01/21/2019   HGBA1C 8.5 (H) 06/27/2018   Lab Results  Component Value Date   MICROALBUR <0.7 03/27/2019   LDLCALC 116 (H) 07/17/2019   CREATININE 1.03 01/21/2019     Lab Results  Component Value Date   CHOL 225 (H) 07/17/2019   HDL 35 (L)  07/17/2019   LDLCALC 116 (H) 07/17/2019   LDLDIRECT 97 07/17/2019   TRIG 424 (H) 07/17/2019   CHOLHDL 6.4 (H) 07/17/2019        Results for Marcus, Mitchell (MRN [id number]) as of 03/29/2019 21:11  Ref. Range 03/27/2019 11:24  MICROALB/CREAT RATIO Latest Ref Range: 0.0 - 30.0 mg/g 1.5     ASSESSMENT / PLAN / RECOMMENDATIONS:   1) 1) Type 2 Diabetes Mellitus, Optimally controlled, Without complications - Most recent A1c of 6.8 %. Goal A1c < 7.0 %.     - I have praised the pt on improved glycemic control  - He had issues  with metformin casing diarrhea early on , but once he switched it to night time ( after dinner) he has done better. He switched all oral glycemic agents to night time and doing well  - He is not a candidate for GLP-1 agonists nor DPP-4 inhibitors due to risk of pancreatitis - He was asked to try and increase Metformin to 2 tabs daily , my future goal is to try and get him off Pioglitazone if possible.   MEDICATIONS: - Farxiga 10 mg daily  - Pioglitazone 30 mg daily  - Increase Metformin 500 mg, to 2 tablets daily    EDUCATION / INSTRUCTIONS:  BG monitoring instructions: Patient is instructed to check his blood sugars 1 times a day, fasting  Call [place] [facility] clinic if: BG persistently < 70 or > 300.  I reviewed the Rule of 15 for the treatment of hypoglycemia in detail with the patient. Literature supplied.    F/U in 6 months    Signed electronically by: Mack Guise, MD  [facility] Endocrinology  [facility] [address] [place], [place] [address] Phone: [phone] FAX: [phone]   CC: Antony Contras, MD [address] [place] [address] Phone: [phone]  Fax: [phone]  Return to Endocrinology clinic as below: Future Appointments  Date Time Provider [place]  11/19/2019  8:50 AM Jacksen Isip, Melanie Crazier, MD [facility] None  01/04/2020  8:00 AM Hilty, Nadean Corwin,  MD [facility]

## 2019-12-18 DIAGNOSIS — K219 Gastro-esophageal reflux disease without esophagitis: Secondary | ICD-10-CM | POA: Diagnosis not present

## 2019-12-18 DIAGNOSIS — E1169 Type 2 diabetes mellitus with other specified complication: Secondary | ICD-10-CM | POA: Diagnosis not present

## 2019-12-18 DIAGNOSIS — E7800 Pure hypercholesterolemia, unspecified: Secondary | ICD-10-CM | POA: Diagnosis not present

## 2019-12-18 DIAGNOSIS — I10 Essential (primary) hypertension: Secondary | ICD-10-CM | POA: Diagnosis not present

## 2019-12-22 DIAGNOSIS — E781 Pure hyperglyceridemia: Secondary | ICD-10-CM | POA: Diagnosis not present

## 2019-12-23 LAB — LIPID PANEL
Chol/HDL Ratio: 5.2 ratio — ABNORMAL HIGH (ref 0.0–5.0)
Cholesterol, Total: 173 mg/dL (ref 100–199)
HDL: 33 mg/dL — ABNORMAL LOW (ref >39)
LDL Chol Calc (NIH): 104 mg/dL — ABNORMAL HIGH (ref 0–99)
Triglycerides: 210 mg/dL — ABNORMAL HIGH (ref 0–149)
VLDL Cholesterol Cal: 36 mg/dL (ref 5–40)

## 2019-12-23 LAB — LDL CHOLESTEROL, DIRECT: LDL Direct: 94 mg/dL (ref 0–99)

## 2020-01-04 ENCOUNTER — Encounter: Payer: Self-pay | Admitting: Internal Medicine

## 2020-01-04 ENCOUNTER — Telehealth (INDEPENDENT_AMBULATORY_CARE_PROVIDER_SITE_OTHER): Payer: BLUE CROSS/BLUE SHIELD | Admitting: Internal Medicine

## 2020-01-04 VITALS — BP 116/60 | HR 79 | Wt 177.0 lb

## 2020-01-04 DIAGNOSIS — E781 Pure hyperglyceridemia: Secondary | ICD-10-CM | POA: Diagnosis not present

## 2020-01-04 DIAGNOSIS — K859 Acute pancreatitis without necrosis or infection, unspecified: Secondary | ICD-10-CM

## 2020-01-04 DIAGNOSIS — E119 Type 2 diabetes mellitus without complications: Secondary | ICD-10-CM

## 2020-01-04 NOTE — Patient Instructions (Signed)
Medication Instructions:  Your physician recommends that you continue on your current medications as directed. Please refer to the Current Medication list given to you today.  *If you need a refill on your cardiac medications before your next appointment, please call your pharmacy*   Lab Work: FASTING lipid panel in 1 year to check cholesterol - please complete about 1 week prior to your next appointment   If you have labs (blood work) drawn today and your tests are completely normal, you will receive your results only by: [name] MyChart Message (if you have MyChart) OR . A paper copy in the mail If you have any lab test that is abnormal or we need to change your treatment, we will call you to review the results.  Follow-Up: At [facility], you and your health needs are our priority.  As part of our continuing mission to provide you with exceptional heart care, we have created designated Provider Care Teams.  These Care Teams include your primary Cardiologist (physician) and Advanced Practice Providers (APPs -  Physician Assistants and Nurse Practitioners) who all work together to provide you with the care you need, when you need it.  We recommend signing up for the patient portal called "MyChart".  Sign up information is provided on this After Visit Summary.  MyChart is used to connect with patients for Virtual Visits (Telemedicine).  Patients are able to view lab/test results, encounter notes, upcoming appointments, etc.  Non-urgent messages can be sent to your provider as well.   To learn more about what you can do with MyChart, go to [email or web address].    Your next appointment:   12 month(s)  The format for your next appointment:   In Person or Virtual  Provider:   Kirtland Bouchard [place] Hilty, MD   Other Instructions

## 2020-01-04 NOTE — Progress Notes (Signed)
Virtual Visit via Video Note      Virtual Visit via Telephone Note   This visit type was conducted due to national recommendations for restrictions regarding the COVID-19 Pandemic (e.g. social distancing) in an effort to limit this patient's exposure and mitigate transmission in our community.  Due to his co-morbid illnesses, this patient is at least at moderate risk for complications without adequate follow up.  This format is felt to be most appropriate for this patient at this time.  The patient did not have access to video technology/had technical difficulties with video requiring transitioning to audio format only (telephone).  All issues noted in this document were discussed and addressed.  No physical exam could be performed with this format.  Please refer to the patient's chart for his  consent to telehealth for [facility].   Evaluation Performed:  Telephone follow-up visit  Date:  01/04/2020   ID:  Marcus Mitchell, DOB 1970-07-03, MRN [id number] The patient was identified using 2 identifiers.  Patient Location:  [address]  [place] [address]  Provider location:   [address] [place], [place] [address]  PCP:  Antony Contras, MD  Cardiologist:  No primary care provider on file. Electrophysiologist:  None   Chief Complaint:  Follow-up dyslipidemia  History of Present Illness:    Marcus Mitchell is a 49 y.o. male who presents via audio/video conferencing for a telehealth visit today.  Marcus Mitchell is a pleasant 49 year old male I been following for type V hypertriglyceridemia and recurrent pancreatitis.  He also has type 2 diabetes and a history of psoriatic arthritis.  Unfortunately, he recently was hospitalized less than a month ago with COVID-19 infection.  Both he and his wife were hospitalized and had pneumonia.  He was treated with high-dose steroids, nebulizers and supportive care.  He did not require remdesivir.  He was subsequently  discharged and had been recovering at home.  He reports he has been very slow to recover but does feel that he is getting better.  He also had a steroid taper that ended just a week ago.  Is consequential as he recently had repeat labs which showed a total cholesterol 305, triglycerides were very high again at 1391, HDL 25.  Direct LDL however was 57.  Receiving steroids while hospitalized on 02/10/2019, however his labs were repeated and this demonstrated total cholesterol 136, LDL 59, triglycerides of 330 and HDL 26.  Overall marked improvement in his lipid profile and demonstrating significant improvement on his current medical regimen.  01/04/2020  Marcus Mitchell is seen today in follow-up.  Overall he seems to be doing well.  He has recovered from COVID-19 which she had earlier in the year and required steroids.  His triglycerides were significantly elevated probably related to this.  Repeat labs however have continued to be much improved on therapy.  Total cholesterol now 173, triglycerides 210, down from 1391 about 10 months ago, HDL 33 and LDL 104.  Direct LDL was 94.  He reports some recurrent minor episodes of what he feels like are pancreatitis.  I reassured him I do not think it is related to his triglyceride levels, however could be auto pancreatitis given his history of pancreatitis in the past.  He was previously on pancreatic enzymes but had come off of those and may need to restart them.  I encouraged him to follow-up with his PCP.  The patient does have symptoms concerning for COVID-19 infection (fever, chills, cough,  or new SHORTNESS OF BREATH).  Patient is recovering from recent COVID-19 infection.   Prior CV studies:   The following studies were reviewed today:  Chart reviewed Lab work  PMHx:  Past Medical History:  Diagnosis Date  . Chronic recurrent pancreatitis ([place]) 08/05/2014  . Diabetes mellitus type II, controlled ([place])   . GERD (gastroesophageal reflux disease)   .  Hyperlipemia   . Hypertension   . Hypertriglyceridemia   . Hypertriglyceridemia, familial 08/05/2014  . Kidney stones   . NAFLD (nonalcoholic fatty liver disease)   . Pancreatitis, recurrent   . Plaque psoriasis   . Psoriatic arthritis [facility])     Past Surgical History:  Procedure Laterality Date  . CHOLECYSTECTOMY OPEN    . DUODENITIS    . ESOPHAGOGASTRODUODENOSCOPY    . EUS N/A 07/20/2015   Procedure: UPPER ENDOSCOPIC ULTRASOUND (EUS) RADIAL;  Surgeon: Arta Silence, MD;  Location: [facility] ENDOSCOPY;  Service: Endoscopy;  Laterality: N/A;    FAMHx:  Family History  Problem Relation Age of Onset  . Hypertension Mother   . Diabetes Mother   . Hyperlipidemia Father     SOCHx:   reports that he has quit smoking. His smoking use included cigarettes. He has never used smokeless tobacco. He reports that he does not drink alcohol and does not use drugs.  ALLERGIES:  Allergies  Allergen Reactions  . Nsaids Hives  . Niacin And Related Other (See Comments)    flushing of face    MEDS:  Current Meds  Medication Sig  . Blood Glucose Monitoring Suppl (CONTOUR NEXT MONITOR) w/Device KIT 1 Device by Does not apply route daily.  . cetirizine (ZYRTEC) 10 MG tablet Take 10 mg by mouth daily.  . dapagliflozin propanediol (FARXIGA) 10 MG TABS tablet Take 1 tablet (10 mg total) by mouth daily.  . fenofibrate micronized (LOFIBRA) 134 MG capsule Take 134 mg by mouth daily. daily.  [name] glucose blood test strip Use as instructed to test blood sugars 2 times daily E11.65  . HUMIRA PEN 40 MG/0.8ML PNKT Inject 40 mg into the skin every 14 (fourteen) days.   [name] losartan (COZAAR) 50 MG tablet Take 50 mg by mouth daily.  . metFORMIN (GLUCOPHAGE) 500 MG tablet Take 500 mg by mouth daily with breakfast.  . omeprazole (PRILOSEC OTC) 20 MG tablet Take 20 mg by mouth daily.   . pioglitazone (ACTOS) 30 MG tablet Take 1 tablet (30 mg total) by mouth daily.  . rosuvastatin (CRESTOR) 20 MG tablet Take 1 tablet (20  mg total) by mouth daily.  [name] VASCEPA 1 g capsule TAKE 2 CAPSULES(2 GRAMS) BY MOUTH TWICE DAILY  . [facility] Lancets MISC 1 Package by Does not apply route 2 (two) times daily.  . [DISCONTINUED] metFORMIN (GLUCOPHAGE) 500 MG tablet Take 2 tablets (1,000 mg total) by mouth daily. (Patient taking differently: Take 500 mg by mouth daily. )     ROS: Pertinent items noted in HPI and remainder of comprehensive ROS otherwise negative.  Labs/Other Tests and Data Reviewed:    Recent Labs: 01/21/2019: BUN 16; Creatinine, Ser 1.03; Potassium 4.3; Sodium 136   Recent Lipid Panel Lab Results  Component Value Date/Time   CHOL 173 12/22/2019 09:26 AM   TRIG 210 (H) 12/22/2019 09:26 AM   HDL 33 (L) 12/22/2019 09:26 AM   CHOLHDL 5.2 (H) 12/22/2019 09:26 AM   CHOLHDL 7 10/12/2014 08:33 AM   LDLCALC 104 (H) 12/22/2019 09:26 AM   LDLDIRECT 94 12/22/2019 09:27 AM  LDLDIRECT 31.0 10/12/2014 08:33 AM    Wt Readings from Last 3 Encounters:  01/04/20 177 lb (80.3 kg)  11/19/19 181 lb 12.8 oz (82.5 kg)  07/01/19 181 lb 9.6 oz (82.4 kg)     Exam:    Vital Signs:  BP 116/60   Pulse 79   Wt 177 lb (80.3 kg)   BMI 30.38 kg/m    Deferred  ASSESSMENT & PLAN:    1. Type V hypertriglyceridemia - recurrent pancreatitis 2. Type 2 diabetes 3. Psoriatic arthritis 4. Recent COVID-19 infection  Mr. Haithcock has had significant improvement in his triglycerides although thinks he is still having some mild episodes of pancreatitis.  He may need to resume pancreatic enzymes.  I have encouraged him to follow-up with his PCP regarding this.  There may also need to be some dietary changes as well.  Plan to continue his current medications for now.  COVID-19 Education: The signs and symptoms of COVID-19 were discussed with the patient and how to seek care for testing (follow up with PCP or arrange E-visit).  The importance of social distancing was discussed today.  Patient Risk:   After full review of this  patients clinical status, I feel that they are at least moderate risk at this time.  Time:   Today, I have spent 15 minutes with the patient with telehealth technology discussing dyslipidemia, elevated triglycerides, COVID-19 education.     Medication Adjustments/Labs and Tests Ordered: Current medicines are reviewed at length with the patient today.  Concerns regarding medicines are outlined above.   Tests Ordered: No orders of the defined types were placed in this encounter.   Medication Changes: No orders of the defined types were placed in this encounter.   Disposition:  in 1 year(s)  Pixie Casino, MD, [facility], [place] Director of the Advanced Lipid Disorders &  Cardiovascular Risk Reduction Clinic Diplomate of the American Board of Clinical Lipidology Attending Cardiologist  Direct Dial: [phone]  Fax: [phone]  Website:  [email or web address]  Pixie Casino, MD  01/04/2020 8:04 AM

## 2020-01-06 DIAGNOSIS — Z79899 Other long term (current) drug therapy: Secondary | ICD-10-CM | POA: Diagnosis not present

## 2020-01-06 DIAGNOSIS — L4050 Arthropathic psoriasis, unspecified: Secondary | ICD-10-CM | POA: Diagnosis not present

## 2020-02-15 DIAGNOSIS — L4050 Arthropathic psoriasis, unspecified: Secondary | ICD-10-CM | POA: Diagnosis not present

## 2020-02-15 DIAGNOSIS — R200 Anesthesia of skin: Secondary | ICD-10-CM | POA: Diagnosis not present

## 2020-02-15 DIAGNOSIS — R208 Other disturbances of skin sensation: Secondary | ICD-10-CM | POA: Diagnosis not present

## 2020-02-22 DIAGNOSIS — R202 Paresthesia of skin: Secondary | ICD-10-CM | POA: Diagnosis not present

## 2020-03-01 DIAGNOSIS — M792 Neuralgia and neuritis, unspecified: Secondary | ICD-10-CM | POA: Diagnosis not present

## 2020-03-01 DIAGNOSIS — L4050 Arthropathic psoriasis, unspecified: Secondary | ICD-10-CM | POA: Diagnosis not present

## 2020-03-01 DIAGNOSIS — R208 Other disturbances of skin sensation: Secondary | ICD-10-CM | POA: Diagnosis not present

## 2020-05-14 ENCOUNTER — Other Ambulatory Visit: Payer: Self-pay | Admitting: Internal Medicine

## 2020-05-26 DIAGNOSIS — R202 Paresthesia of skin: Secondary | ICD-10-CM | POA: Diagnosis not present

## 2020-05-26 DIAGNOSIS — M792 Neuralgia and neuritis, unspecified: Secondary | ICD-10-CM | POA: Diagnosis not present

## 2020-05-26 DIAGNOSIS — R208 Other disturbances of skin sensation: Secondary | ICD-10-CM | POA: Diagnosis not present

## 2020-05-26 DIAGNOSIS — L4050 Arthropathic psoriasis, unspecified: Secondary | ICD-10-CM | POA: Diagnosis not present

## 2020-06-28 DIAGNOSIS — J069 Acute upper respiratory infection, unspecified: Secondary | ICD-10-CM | POA: Diagnosis not present

## 2020-06-28 DIAGNOSIS — E1169 Type 2 diabetes mellitus with other specified complication: Secondary | ICD-10-CM | POA: Diagnosis not present

## 2020-06-28 DIAGNOSIS — R059 Cough, unspecified: Secondary | ICD-10-CM | POA: Diagnosis not present

## 2020-06-28 DIAGNOSIS — Z20822 Contact with and (suspected) exposure to covid-19: Secondary | ICD-10-CM | POA: Diagnosis not present

## 2020-06-28 DIAGNOSIS — Z8616 Personal history of COVID-19: Secondary | ICD-10-CM | POA: Diagnosis not present

## 2020-07-01 ENCOUNTER — Other Ambulatory Visit: Payer: Self-pay | Admitting: Family

## 2020-07-01 ENCOUNTER — Telehealth: Payer: Self-pay | Admitting: Family

## 2020-07-01 DIAGNOSIS — E781 Pure hyperglyceridemia: Secondary | ICD-10-CM

## 2020-07-01 DIAGNOSIS — U071 COVID-19: Secondary | ICD-10-CM

## 2020-07-01 DIAGNOSIS — E119 Type 2 diabetes mellitus without complications: Secondary | ICD-10-CM

## 2020-07-01 NOTE — Progress Notes (Signed)
I connected by phone with Marcus Mitchell on 07/01/2020 at 2:19 PM to discuss the potential use of a new treatment for mild to moderate COVID-19 viral infection in non-hospitalized patients.  This patient is a 50 y.o. male that meets the FDA criteria for Emergency Use Authorization of COVID monoclonal antibody sotrovimab.  Has a (+) direct SARS-CoV-2 viral test result  Has mild or moderate COVID-19   Is NOT hospitalized due to COVID-19  Is within 10 days of symptom onset  Has at least one of the high risk factor(s) for progression to severe COVID-19 and/or hospitalization as defined in EUA.  Specific high risk criteria : Diabetes and Cardiovascular disease or hypertension   I have spoken and communicated the following to the patient or parent/caregiver regarding COVID monoclonal antibody treatment:  1. FDA has authorized the emergency use for the treatment of mild to moderate COVID-19 in adults and pediatric patients with positive results of direct SARS-CoV-2 viral testing who are 49 years of age and older weighing at least 40 kg, and who are at high risk for progressing to severe COVID-19 and/or hospitalization.  2. The significant known and potential risks and benefits of COVID monoclonal antibody, and the extent to which such potential risks and benefits are unknown.  3. Information on available alternative treatments and the risks and benefits of those alternatives, including clinical trials.  4. Patients treated with COVID monoclonal antibody should continue to self-isolate and use infection control measures (e.g., wear mask, isolate, social distance, avoid sharing personal items, clean and disinfect "high touch" surfaces, and frequent handwashing) according to CDC guidelines.   5. The patient or parent/caregiver has the option to accept or refuse COVID monoclonal antibody treatment.  After reviewing this information with the patient, the patient has agreed to receive one of the  available covid 19 monoclonal antibodies and will be provided an appropriate fact sheet prior to infusion.   Jeanine Luz, FNP 07/01/2020 2:19 PM

## 2020-07-01 NOTE — Telephone Encounter (Signed)
Called to discuss with patient about COVID-19 symptoms and the use of one of the available treatments for those with mild to moderate Covid symptoms and at a high risk of hospitalization.  Pt appears to qualify for outpatient treatment due to co-morbid conditions and/or a member of an at-risk group in accordance with the FDA Emergency Use Authorization.    Symptom onset: 2/7 Vaccinated: Yes Booster? Immunocompromised? Yes Qualifiers: Type 2 diabetes, hypertriglyceridemia  I called Marcus Mitchell and was unable to reach him via phone and left a voicemail with call back number. There was no MyChart available.   [name], NP 07/01/2020 12:12 PM

## 2020-07-01 NOTE — Telephone Encounter (Signed)
No booster. Currently having cough, congestion, sore throat, sinus pressure, headache, and fatigue. I spoke with Mr. Procter about the risks, benefits and potential financial costs associated with treatment with Sotrovimab. He wishes to continue with treatment.   Hello Marcus Mitchell,   We contacted you because you were recently diagnosed with COVID-19 and may benefit from a new treatment for mild to moderate disease. This treatment helps reduce the chance of being hospitalized. For some patients with medical conditions that may increase the chances of an infection, the treatment also decreases the risk for serious symptoms related to COVID-19.   The Food and Drug Administration (FDA) approved emergency use of a new drug to treat patients with mild to moderate symptoms who have risk factors that could cause severe symptoms related to COVID-19. This new treatment is a monoclonal antibody. It works by attaching like a magnet to the SARS-CoV2 virus (the virus that causes COVID-19) and stops it from infecting more cells in your body. It does not kill the virus, but it prevents it from spreading throughout your body with the hope that it will decrease your symptoms after it is administered.   This new drug is an intravenous (IV) infusion called Sotrovimab that is given over one 30-minute session in our [facility] outpatient infusion clinic. You will need to stay about 60 minutes after the infusion to ensure you are tolerating it well and to watch for any allergic reaction to the medication. More information will be given to you at the time of your appointment.   Important information:  . The potential side effects: 2-4% of recipients experience nausea, vomiting, diarrhea, dizziness, headaches, itching, worsening fevers or chills for around 24 hours. . There have been no serious infusion-related reactions. . Of the more than 3,000 patients who received the infusion, only one had an allergic response that  ended once the infusion was stopped. This is why we monitor all of our patients closely for 60 minutes after the infusion.  . The COVID-19 vaccine (including boosters) must be delayed at least 90 days after receiving this infusion.  . The medication itself is free, but your insurance will be charged an infusion fee. The amount you may owe later varies from insurance to insurance. If you do not have insurance, we can put you in touch with our billing department. Please contact your insurance agent to discuss prior to your appointment if you would like further details about billing specific to your policy. The CMS code is: M65  . If you have been tested outside of a [facility], you MUST bring a copy of your positive test with you the morning of your appointment. You may take a photo of this and upload to your MyChart portal,  have the testing facility fax the result to [phone] or email a copy to [email or web address].    You have been scheduled to receive the monoclonal antibody therapy at [facility]:  07/02/20 at 11:30am     The address for the infusion clinic site is:   --The GPS address is [address], and the parking is located near the [employer] building where you will see a "COVID19 Infusion" feather banner marking the entrance to parking. (See photos below.)            --Enter into the 2nd entrance where the "wave, flag banner" is at the road. Turn into this 2nd entrance and immediately turn left or right to park in one of the  marked spaces.   --Please stay in your car and call the desk for assistance inside at [phone]. Let us know which space you are in.    --The average time in the department is roughly 90 minutes to two hours for monoclonal treatment. This includes preparation of the medication, IV start and the required 60-minute monitoring after the infusion.     Should you develop worsening shortness of breath, chest pain or severe breathing  problems, please do not wait for this appointment and go to the emergency room for evaluation and treatment instead. You will undergo another oxygen screen before your infusion to ensure this is the best treatment option for you. There is a chance that the best decision may be to send you to the emergency room for evaluation at the time of your appointment.    The day of your visit, you should: [name] Get plenty of rest the night before and drink plenty of water. . Eat a light meal/snack before coming and take your medications as prescribed.  . Wear warm, comfortable clothes with a shirt that can roll-up over the elbow (will need IV start).  . Wear a mask.  . Consider bringing an activity to help pass the time.     Regards,  Jeanine Luz

## 2020-07-02 ENCOUNTER — Ambulatory Visit (HOSPITAL_COMMUNITY)
Admission: RE | Admit: 2020-07-02 | Discharge: 2020-07-02 | Disposition: A | Discharge status: Home / Self Care | Payer: BC Managed Care – PPO | Source: Ambulatory Visit | Attending: Pulmonary Disease | Admitting: Pulmonary Disease

## 2020-07-02 ENCOUNTER — Other Ambulatory Visit (HOSPITAL_COMMUNITY): Payer: Self-pay

## 2020-07-02 DIAGNOSIS — U071 COVID-19: Secondary | ICD-10-CM | POA: Insufficient documentation

## 2020-07-02 DIAGNOSIS — E119 Type 2 diabetes mellitus without complications: Secondary | ICD-10-CM

## 2020-07-02 DIAGNOSIS — E781 Pure hyperglyceridemia: Secondary | ICD-10-CM

## 2020-07-02 MED ORDER — METHYLPREDNISOLONE SODIUM SUCC 125 MG IJ SOLR: 125.0000 mg | Freq: Once | INTRAMUSCULAR | Status: DC | PRN

## 2020-07-02 MED ORDER — FAMOTIDINE IN NACL 20-0.9 MG/50ML-% IV SOLN: 20.0000 mg | Freq: Once | INTRAVENOUS | Status: DC | PRN

## 2020-07-02 MED ORDER — EPINEPHRINE 0.3 MG/0.3ML IJ SOAJ: 0.3000 mg | Freq: Once | INTRAMUSCULAR | Status: DC | PRN

## 2020-07-02 MED ORDER — ALBUTEROL SULFATE HFA 108 (90 BASE) MCG/ACT IN AERS: 2.0000 | INHALATION_SPRAY | Freq: Once | RESPIRATORY_TRACT | Status: DC | PRN

## 2020-07-02 MED ORDER — DIPHENHYDRAMINE HCL 50 MG/ML IJ SOLN: 50.0000 mg | Freq: Once | INTRAMUSCULAR | Status: DC | PRN

## 2020-07-02 MED ORDER — SOTROVIMAB 500 MG/8ML IV SOLN
500.0000 mg | Freq: Once | INTRAVENOUS | Status: AC
  Administered 2020-07-02: 500 mg via INTRAVENOUS

## 2020-07-02 MED ORDER — SODIUM CHLORIDE 0.9 % IV SOLN: INTRAVENOUS | Status: DC | PRN

## 2020-07-02 NOTE — Progress Notes (Signed)
Patient reviewed Fact Sheet for Patients, Parents, and Caregivers for Emergency Use Authorization (EUA) of sotrovimab for the Treatment of Coronavirus. Patient also reviewed and is agreeable to the estimated cost of treatment. Patient is agreeable to proceed.   

## 2020-07-02 NOTE — Discharge Instructions (Signed)

## 2020-07-02 NOTE — Progress Notes (Signed)
Diagnosis: COVID-19  Physician: Dr. Patrick Wright  Procedure: Covid Infusion Clinic Med: Sotrovimab infusion - Provided patient with sotrovimab fact sheet for patients, parents, and caregivers prior to infusion.   Complications: No immediate complications noted  Discharge: Discharged home    

## 2020-07-13 DIAGNOSIS — Z79899 Other long term (current) drug therapy: Secondary | ICD-10-CM | POA: Diagnosis not present

## 2020-07-13 DIAGNOSIS — L409 Psoriasis, unspecified: Secondary | ICD-10-CM | POA: Diagnosis not present

## 2020-07-13 DIAGNOSIS — L4050 Arthropathic psoriasis, unspecified: Secondary | ICD-10-CM | POA: Diagnosis not present

## 2020-08-09 DIAGNOSIS — M792 Neuralgia and neuritis, unspecified: Secondary | ICD-10-CM | POA: Diagnosis not present

## 2020-08-09 DIAGNOSIS — G609 Hereditary and idiopathic neuropathy, unspecified: Secondary | ICD-10-CM | POA: Diagnosis not present

## 2020-08-09 DIAGNOSIS — R202 Paresthesia of skin: Secondary | ICD-10-CM | POA: Diagnosis not present

## 2020-08-09 DIAGNOSIS — R208 Other disturbances of skin sensation: Secondary | ICD-10-CM | POA: Diagnosis not present

## 2020-08-09 DIAGNOSIS — L4050 Arthropathic psoriasis, unspecified: Secondary | ICD-10-CM | POA: Diagnosis not present

## 2020-09-07 DIAGNOSIS — R0981 Nasal congestion: Secondary | ICD-10-CM | POA: Diagnosis not present

## 2020-09-07 DIAGNOSIS — J40 Bronchitis, not specified as acute or chronic: Secondary | ICD-10-CM | POA: Diagnosis not present

## 2020-09-08 DIAGNOSIS — R059 Cough, unspecified: Secondary | ICD-10-CM | POA: Diagnosis not present

## 2020-09-08 DIAGNOSIS — Z03818 Encounter for observation for suspected exposure to other biological agents ruled out: Secondary | ICD-10-CM | POA: Diagnosis not present

## 2020-11-02 ENCOUNTER — Other Ambulatory Visit: Payer: Self-pay | Admitting: Internal Medicine

## 2020-11-02 NOTE — Telephone Encounter (Signed)
Need appt for more refills

## 2020-11-03 ENCOUNTER — Other Ambulatory Visit: Payer: Self-pay | Admitting: *Deleted

## 2020-11-03 DIAGNOSIS — E119 Type 2 diabetes mellitus without complications: Secondary | ICD-10-CM

## 2020-11-03 DIAGNOSIS — R202 Paresthesia of skin: Secondary | ICD-10-CM | POA: Diagnosis not present

## 2020-11-03 DIAGNOSIS — L4050 Arthropathic psoriasis, unspecified: Secondary | ICD-10-CM | POA: Diagnosis not present

## 2020-11-03 DIAGNOSIS — M792 Neuralgia and neuritis, unspecified: Secondary | ICD-10-CM | POA: Diagnosis not present

## 2020-11-03 DIAGNOSIS — R208 Other disturbances of skin sensation: Secondary | ICD-10-CM | POA: Diagnosis not present

## 2020-11-03 DIAGNOSIS — E781 Pure hyperglyceridemia: Secondary | ICD-10-CM

## 2020-11-09 ENCOUNTER — Ambulatory Visit: Payer: BC Managed Care – PPO | Admitting: Internal Medicine

## 2020-11-16 DIAGNOSIS — L409 Psoriasis, unspecified: Secondary | ICD-10-CM | POA: Diagnosis not present

## 2020-11-16 DIAGNOSIS — L4050 Arthropathic psoriasis, unspecified: Secondary | ICD-10-CM | POA: Diagnosis not present

## 2020-11-16 DIAGNOSIS — Z79899 Other long term (current) drug therapy: Secondary | ICD-10-CM | POA: Diagnosis not present

## 2020-12-02 ENCOUNTER — Other Ambulatory Visit: Payer: Self-pay | Admitting: Internal Medicine

## 2020-12-07 ENCOUNTER — Other Ambulatory Visit: Payer: Self-pay | Admitting: Internal Medicine

## 2020-12-21 ENCOUNTER — Other Ambulatory Visit: Payer: Self-pay | Admitting: Internal Medicine

## 2020-12-21 ENCOUNTER — Ambulatory Visit (INDEPENDENT_AMBULATORY_CARE_PROVIDER_SITE_OTHER): Payer: BC Managed Care – PPO | Admitting: Internal Medicine

## 2020-12-21 ENCOUNTER — Other Ambulatory Visit: Payer: Self-pay

## 2020-12-21 VITALS — BP 126/82 | HR 88 | Ht 65.0 in | Wt 176.0 lb

## 2020-12-21 DIAGNOSIS — E781 Pure hyperglyceridemia: Secondary | ICD-10-CM

## 2020-12-21 DIAGNOSIS — E119 Type 2 diabetes mellitus without complications: Secondary | ICD-10-CM

## 2020-12-21 DIAGNOSIS — M2550 Pain in unspecified joint: Secondary | ICD-10-CM | POA: Insufficient documentation

## 2020-12-21 LAB — COMPREHENSIVE METABOLIC PANEL
ALT: 28 U/L (ref 0–53)
AST: 20 U/L (ref 0–37)
Albumin: 4.6 g/dL (ref 3.5–5.2)
Alkaline Phosphatase: 47 U/L (ref 39–117)
BUN: 17 mg/dL (ref 6–23)
CO2: 24 mEq/L (ref 19–32)
Calcium: 9.2 mg/dL (ref 8.4–10.5)
Chloride: 102 mEq/L (ref 96–112)
Creatinine, Ser: 1 mg/dL (ref 0.40–1.50)
GFR: 89.44 mL/min (ref >60.00)
Glucose, Bld: 138 mg/dL — ABNORMAL HIGH (ref 70–99)
Potassium: 4.3 mEq/L (ref 3.5–5.1)
Sodium: 136 mEq/L (ref 135–145)
Total Bilirubin: 0.6 mg/dL (ref 0.2–1.2)
Total Protein: 7.3 g/dL (ref 6.0–8.3)

## 2020-12-21 LAB — LIPID PANEL
Cholesterol: 184 mg/dL (ref 0–200)
HDL: 37.2 mg/dL — ABNORMAL LOW (ref >39.00)
NonHDL: 147.15
Total CHOL/HDL Ratio: 5
Triglycerides: 325 mg/dL — ABNORMAL HIGH (ref 0.0–149.0)
VLDL: 65 mg/dL — ABNORMAL HIGH (ref 0.0–40.0)

## 2020-12-21 LAB — POCT GLYCOSYLATED HEMOGLOBIN (HGB A1C): Hemoglobin A1C: 7.3 % — AB (ref 4.0–5.6)

## 2020-12-21 LAB — MICROALBUMIN / CREATININE URINE RATIO
Creatinine,U: 81.3 mg/dL
Microalb Creat Ratio: 0.9 mg/g (ref 0.0–30.0)
Microalb, Ur: <0.7 mg/dL (ref 0.0–1.9)

## 2020-12-21 LAB — VITAMIN D 25 HYDROXY (VIT D DEFICIENCY, FRACTURES): VITD: 25.22 ng/mL — ABNORMAL LOW (ref 30.00–100.00)

## 2020-12-21 LAB — VITAMIN B12: Vitamin B-12: 535 pg/mL (ref 211–911)

## 2020-12-21 LAB — LDL CHOLESTEROL, DIRECT: Direct LDL: 76 mg/dL

## 2020-12-21 LAB — TSH: TSH: 1.28 u[IU]/mL (ref 0.35–5.50)

## 2020-12-21 MED ORDER — GLIPIZIDE 5 MG PO TABS: 5.0000 mg | ORAL_TABLET | Freq: Every day | ORAL | 1 refills | Status: DC

## 2020-12-21 NOTE — Progress Notes (Signed)
Name: Marcus Mitchell  Age/ Sex: 50 y.o., male   MRN/ DOB: [id number], [birth year]     PCP: Antony Contras, MD   Reason for Endocrinology Evaluation: Type 2 Diabetes Mellitus  Initial Endocrine Consultative Visit: 01/21/2019    PATIENT IDENTIFIER: Marcus Mitchell is a 50 y.o. male with a past medical history of T2DM , Hx of pancreatitis, Psoriatic arthritis  and Dyslipidemia. The patient has followed with Endocrinology clinic since 01/21/2019 for consultative assistance with management of his diabetes.  DIABETIC HISTORY:  Marcus Mitchell was diagnosed with T2DM many years ago,he has reported intolerance to Metformin. His hemoglobin A1c has ranged from 7.7% in 2019, peaking at 8.5% in 2020.   On his initial visit to our clinic he had an A1c of  8.2% ,And was on pioglitazone 30 mg daily , we started [place]  Metformin started 06/2019 but stopped in 2022 due to intolerance    Mother with DM Denies ETOH use SUBJECTIVE:   During the last visit (11/19/2019): A1c 6.8 %. Increased  metformin, continued  [place] and  pioglitazone      Today (12/21/2020): Marcus Mitchell is here for a  follow up on diabetes management. He has NOT been to our clinic in 12 months.  He checks his blood sugars 0 times a day. Otherwise, the patient has not required any recent emergency interventions for hypoglycemia or hyperglycemia.    Metformin makes him sick so he stopped it   Has been having feet pain   Has burning pain of the right arm  Recent poly arthralgia , on gabapentin and nortriptyline   Has been taking 50 % of Vascepa dose     HOME DIABETES REGIMEN:  Pioglitazone 30 mg daily  Farxiga 10 mg daily  Metformin 500 mg BID- not taking      METER DOWNLOAD SUMMARY: No data on download  DIABETIC COMPLICATIONS: Microvascular complications:  Denies: retinopathy, CKD neuropathy  Last eye exam: Completed 2019     Macrovascular complications:   Denies: CAD, PVD, CVA  i  HISTORY:  Past  Medical History:  Past Medical History:  Diagnosis Date   Chronic recurrent pancreatitis ([place]) 08/05/2014   Diabetes mellitus type II, controlled ([place])    GERD (gastroesophageal reflux disease)    Hyperlipemia    Hypertension    Hypertriglyceridemia    Hypertriglyceridemia, familial 08/05/2014   Kidney stones    NAFLD (nonalcoholic fatty liver disease)    Pancreatitis, recurrent    Plaque psoriasis    Psoriatic arthritis ([place])    Past Surgical History:  Past Surgical History:  Procedure Laterality Date   CHOLECYSTECTOMY OPEN     DUODENITIS     ESOPHAGOGASTRODUODENOSCOPY     EUS N/A 07/20/2015   Procedure: UPPER ENDOSCOPIC ULTRASOUND (EUS) RADIAL;  Surgeon: Arta Silence, MD;  Location: [facility];  Service: Endoscopy;  Laterality: N/A;   Social History:  reports that he has quit smoking. His smoking use included cigarettes. He has never used smokeless tobacco. He reports that he does not drink alcohol and does not use drugs. Family History:  Family History  Problem Relation Age of Onset   Hypertension Mother    Diabetes Mother    Hyperlipidemia Father      HOME MEDICATIONS: Allergies as of 12/21/2020       Reactions   Nsaids Hives   Metformin And Related Diarrhea   Niacin And Related Other (See Comments)   flushing of face  Medication List        Accurate as of December 21, 2020  8:28 AM. If you have any questions, ask your nurse or doctor.          STOP taking these medications    metFORMIN 500 MG tablet Commonly known as: GLUCOPHAGE Stopped by: Dorita Sciara, MD       TAKE these medications    cetirizine 10 MG tablet Commonly known as: ZYRTEC Take 10 mg by mouth daily.   Contour Next Monitor w/Device Kit 1 Device by Does not apply route daily.   dapagliflozin propanediol 10 MG Tabs tablet Commonly known as: Farxiga Take 1 tablet (10 mg total) by mouth daily.   fenofibrate micronized 134 MG capsule Commonly known as:  LOFIBRA Take 134 mg by mouth daily. daily.   glucose blood test strip Use as instructed to test blood sugars 2 times daily E11.65   Humira Pen 40 MG/0.8ML Pnkt Generic drug: Adalimumab Inject 40 mg into the skin every 14 (fourteen) days.   losartan 50 MG tablet Commonly known as: COZAAR Take 50 mg by mouth daily.   nortriptyline 10 MG capsule Commonly known as: PAMELOR Take by mouth 2 (two) times daily.   nortriptyline 25 MG capsule Commonly known as: PAMELOR Take by mouth.   omeprazole 20 MG tablet Commonly known as: PRILOSEC OTC Take 20 mg by mouth daily.   pioglitazone 30 MG tablet Commonly known as: ACTOS TAKE 1 TABLET(30 MG) BY MOUTH DAILY   rosuvastatin 20 MG tablet Commonly known as: CRESTOR Take 1 tablet (20 mg total) by mouth daily.   Vascepa 1 g capsule Generic drug: icosapent Ethyl TAKE 2 CAPSULES(2 GRAMS) BY MOUTH TWICE DAILY   [facility] Lancets Misc 1 Package by Does not apply route 2 (two) times daily.         OBJECTIVE:   Vital Signs: BP 126/82   Pulse 88   Ht '5\' 5"'  (1.651 m)   Wt 176 lb (79.8 kg)   SpO2 97%   BMI 29.29 kg/m   Wt Readings from Last 3 Encounters:  12/21/20 176 lb (79.8 kg)  01/04/20 177 lb (80.3 kg)  11/19/19 181 lb 12.8 oz (82.5 kg)     Exam: General: Pt appears well and is in NAD  Lungs: Clear with good BS bilat with no rales, rhonchi, or wheezes  Heart: RRR with normal S1 and S2 and no gallops; no murmurs; no rub  Abdomen: Normoactive bowel sounds, soft, nontender, without masses or organomegaly palpable  Extremities: No pretibial edema.  Neuro: MS is good with appropriate affect, pt is alert and Ox3        DATA REVIEWED:  Lab Results  Component Value Date   HGBA1C 7.3 (A) 12/21/2020   HGBA1C 6.8 (A) 11/19/2019   HGBA1C 7.5 (A) 07/01/2019   Results for Marcus Mitchell, Marcus Mitchell (MRN [id number]) as of 12/22/2020 09:58  Ref. Range 12/21/2020 08:46  Sodium Latest Ref Range: 135 - 145 mEq/L 136  Potassium Latest  Ref Range: 3.5 - 5.1 mEq/L 4.3  Chloride Latest Ref Range: 96 - 112 mEq/L 102  CO2 Latest Ref Range: 19 - 32 mEq/L 24  Glucose Latest Ref Range: 70 - 99 mg/dL 138 (H)  BUN Latest Ref Range: 6 - 23 mg/dL 17  Creatinine Latest Ref Range: 0.40 - 1.50 mg/dL 1.00  Calcium Latest Ref Range: 8.4 - 10.5 mg/dL 9.2  Alkaline Phosphatase Latest Ref Range: 39 - 117 U/L 47  Albumin Latest Ref Range:  3.5 - 5.2 g/dL 4.6  AST Latest Ref Range: 0 - 37 U/L 20  ALT Latest Ref Range: 0 - 53 U/L 28  Total Protein Latest Ref Range: 6.0 - 8.3 g/dL 7.3  Total Bilirubin Latest Ref Range: 0.2 - 1.2 mg/dL 0.6  GFR Latest Ref Range: >60.00 mL/min 89.44  Total CHOL/HDL Ratio Unknown 5  Cholesterol Latest Ref Range: 0 - 200 mg/dL 184  HDL Cholesterol Latest Ref Range: >39.00 mg/dL 37.20 (L)  Direct LDL Latest Units: mg/dL 76.0  MICROALB/CREAT RATIO Latest Ref Range: 0.0 - 30.0 mg/g 0.9  NonHDL Unknown 147.15  Triglycerides Latest Ref Range: 0.0 - 149.0 mg/dL 325.0 (H)  VLDL Latest Ref Range: 0.0 - 40.0 mg/dL 65.0 (H)  VITD Latest Ref Range: 30.00 - 100.00 ng/mL 25.22 (L)  Vitamin B12 Latest Ref Range: 211 - 911 pg/mL 535  TSH Latest Ref Range: 0.35 - 5.50 uIU/mL 1.28  Creatinine,U Latest Units: mg/dL 81.3  Microalb, Ur Latest Ref Range: 0.0 - 1.9 mg/dL <0.7    ASSESSMENT / PLAN / RECOMMENDATIONS:   1) 1) Type 2 Diabetes Mellitus, sub-optimally controlled, Without complications - Most recent A1c of 7.3 %. Goal A1c < 7.0 %.      -Unable to tolerate metformin - He is not a candidate for GLP-1 agonists nor DPP-4 inhibitors due to risk of pancreatitis -I have recommended adding sulfonylurea, cautioned against hypoglycemia     MEDICATIONS: - Farxiga 10 mg daily  - Pioglitazone 30 mg daily  - start glipizide 5 mg, 1 tablet daily   EDUCATION / INSTRUCTIONS: BG monitoring instructions: Patient is instructed to check his blood sugars 1 times a day, fasting Call [facility] if: BG  persistently < 70  I reviewed the Rule of 15 for the treatment of hypoglycemia in detail with the patient. Literature supplied.      3) Dyslipidemia/Hypertriglyceridemia: Patient is on a Crestor 10 mg , vascepa and fenofibrate. We also discussed that cutting down on his CHO intake will improve his Tg levels.  He admits to forgetting to take the evening dose of the supper Lipid panel today shows optimal LDL level but TG is elevated, I expect this to improve if he start taking Vascepa as prescribed    Rosuvastatin 20 mg daily  Vascepa 2 g BID  Fenofibrate 134 mg daily    4) vitamin D insufficiency:  -We will advised the patient to start OTC vitamin D3 at 1000 IU daily      F/U in 6 months    Signed electronically by: Mack Guise, MD  [facility]  [address] [place], [place] [address] Phone: [phone] FAX: [phone]   CC: Antony Contras, [place] [address] Phone: [phone]  Fax: [phone]  Return to Endocrinology clinic as below: No future appointments.

## 2020-12-21 NOTE — Patient Instructions (Signed)
-   Start Glipizide 5 mg, 1 tablet before Breakfast  - Continue farxiga 10 mg daily  - Continue Pioglitazone 30 mg daily     HOW TO TREAT LOW BLOOD SUGARS (Blood sugar LESS THAN 70 MG/DL) Please follow the RULE OF 15 for the treatment of hypoglycemia treatment (when your (blood sugars are less than 70 mg/dL)   STEP 1: Take 15 grams of carbohydrates when your blood sugar is low, which includes:  3-4 GLUCOSE TABS  OR 3-4 OZ OF JUICE OR REGULAR SODA OR ONE TUBE OF GLUCOSE GEL    STEP 2: RECHECK blood sugar in 15 MINUTES STEP 3: If your blood sugar is still low at the 15 minute recheck --> then, go back to STEP 1 and treat AGAIN with another 15 grams of carbohydrates.

## 2020-12-22 ENCOUNTER — Encounter: Payer: Self-pay | Admitting: Internal Medicine

## 2020-12-22 MED ORDER — PIOGLITAZONE HCL 30 MG PO TABS: 30.0000 mg | ORAL_TABLET | Freq: Every day | ORAL | 3 refills | Status: DC

## 2020-12-22 MED ORDER — DAPAGLIFLOZIN PROPANEDIOL 10 MG PO TABS: 10.0000 mg | ORAL_TABLET | Freq: Every day | ORAL | 3 refills | Status: DC

## 2020-12-29 DIAGNOSIS — H5213 Myopia, bilateral: Secondary | ICD-10-CM | POA: Diagnosis not present

## 2020-12-29 DIAGNOSIS — H524 Presbyopia: Secondary | ICD-10-CM | POA: Diagnosis not present

## 2020-12-29 DIAGNOSIS — E119 Type 2 diabetes mellitus without complications: Secondary | ICD-10-CM | POA: Diagnosis not present

## 2020-12-29 DIAGNOSIS — H52223 Regular astigmatism, bilateral: Secondary | ICD-10-CM | POA: Diagnosis not present

## 2020-12-29 LAB — HM DIABETES EYE EXAM

## 2021-01-03 DIAGNOSIS — E781 Pure hyperglyceridemia: Secondary | ICD-10-CM | POA: Diagnosis not present

## 2021-01-03 DIAGNOSIS — E119 Type 2 diabetes mellitus without complications: Secondary | ICD-10-CM | POA: Diagnosis not present

## 2021-01-04 DIAGNOSIS — G608 Other hereditary and idiopathic neuropathies: Secondary | ICD-10-CM | POA: Diagnosis not present

## 2021-01-04 DIAGNOSIS — L4050 Arthropathic psoriasis, unspecified: Secondary | ICD-10-CM | POA: Diagnosis not present

## 2021-01-04 DIAGNOSIS — E119 Type 2 diabetes mellitus without complications: Secondary | ICD-10-CM | POA: Diagnosis not present

## 2021-01-04 DIAGNOSIS — D84821 Immunodeficiency due to drugs: Secondary | ICD-10-CM | POA: Diagnosis not present

## 2021-01-07 LAB — LIPID PANEL
Chol/HDL Ratio: 6 ratio — ABNORMAL HIGH (ref 0.0–5.0)
Cholesterol, Total: 205 mg/dL — ABNORMAL HIGH (ref 100–199)
HDL: 34 mg/dL — ABNORMAL LOW (ref >39)
LDL Chol Calc (NIH): 106 mg/dL — ABNORMAL HIGH (ref 0–99)
Triglycerides: 380 mg/dL — ABNORMAL HIGH (ref 0–149)
VLDL Cholesterol Cal: 65 mg/dL — ABNORMAL HIGH (ref 5–40)

## 2021-01-27 ENCOUNTER — Other Ambulatory Visit: Payer: Self-pay | Admitting: Internal Medicine

## 2021-01-29 ENCOUNTER — Other Ambulatory Visit: Payer: Self-pay | Admitting: Internal Medicine

## 2021-01-29 DIAGNOSIS — E119 Type 2 diabetes mellitus without complications: Secondary | ICD-10-CM

## 2021-01-31 DIAGNOSIS — Z20822 Contact with and (suspected) exposure to covid-19: Secondary | ICD-10-CM | POA: Diagnosis not present

## 2021-02-02 DIAGNOSIS — E7800 Pure hypercholesterolemia, unspecified: Secondary | ICD-10-CM | POA: Diagnosis not present

## 2021-02-02 DIAGNOSIS — Z0000 Encounter for general adult medical examination without abnormal findings: Secondary | ICD-10-CM | POA: Diagnosis not present

## 2021-02-02 DIAGNOSIS — D696 Thrombocytopenia, unspecified: Secondary | ICD-10-CM | POA: Diagnosis not present

## 2021-02-13 DIAGNOSIS — J0100 Acute maxillary sinusitis, unspecified: Secondary | ICD-10-CM | POA: Diagnosis not present

## 2021-03-09 DIAGNOSIS — G608 Other hereditary and idiopathic neuropathies: Secondary | ICD-10-CM | POA: Diagnosis not present

## 2021-03-09 DIAGNOSIS — M79602 Pain in left arm: Secondary | ICD-10-CM | POA: Diagnosis not present

## 2021-03-09 DIAGNOSIS — M79601 Pain in right arm: Secondary | ICD-10-CM | POA: Diagnosis not present

## 2021-03-09 DIAGNOSIS — R292 Abnormal reflex: Secondary | ICD-10-CM | POA: Diagnosis not present

## 2021-03-09 DIAGNOSIS — R202 Paresthesia of skin: Secondary | ICD-10-CM | POA: Diagnosis not present

## 2021-03-16 DIAGNOSIS — M4802 Spinal stenosis, cervical region: Secondary | ICD-10-CM | POA: Diagnosis not present

## 2021-03-16 DIAGNOSIS — M5030 Other cervical disc degeneration, unspecified cervical region: Secondary | ICD-10-CM | POA: Diagnosis not present

## 2021-03-16 DIAGNOSIS — M2578 Osteophyte, vertebrae: Secondary | ICD-10-CM | POA: Diagnosis not present

## 2021-03-16 DIAGNOSIS — M47812 Spondylosis without myelopathy or radiculopathy, cervical region: Secondary | ICD-10-CM | POA: Diagnosis not present

## 2021-03-16 DIAGNOSIS — R292 Abnormal reflex: Secondary | ICD-10-CM | POA: Diagnosis not present

## 2021-03-16 DIAGNOSIS — M79602 Pain in left arm: Secondary | ICD-10-CM | POA: Diagnosis not present

## 2021-03-16 DIAGNOSIS — R202 Paresthesia of skin: Secondary | ICD-10-CM | POA: Diagnosis not present

## 2021-03-16 DIAGNOSIS — M79601 Pain in right arm: Secondary | ICD-10-CM | POA: Diagnosis not present

## 2021-03-23 DIAGNOSIS — N6320 Unspecified lump in the left breast, unspecified quadrant: Secondary | ICD-10-CM | POA: Diagnosis not present

## 2021-03-23 DIAGNOSIS — M5412 Radiculopathy, cervical region: Secondary | ICD-10-CM | POA: Diagnosis not present

## 2021-03-24 ENCOUNTER — Other Ambulatory Visit: Payer: Self-pay | Admitting: Family Medicine

## 2021-03-24 DIAGNOSIS — N6320 Unspecified lump in the left breast, unspecified quadrant: Secondary | ICD-10-CM

## 2021-03-28 ENCOUNTER — Other Ambulatory Visit: Payer: Self-pay | Admitting: Internal Medicine

## 2021-03-28 DIAGNOSIS — R200 Anesthesia of skin: Secondary | ICD-10-CM | POA: Diagnosis not present

## 2021-03-28 DIAGNOSIS — E119 Type 2 diabetes mellitus without complications: Secondary | ICD-10-CM

## 2021-04-03 ENCOUNTER — Telehealth: Payer: Self-pay | Admitting: Internal Medicine

## 2021-04-03 NOTE — Telephone Encounter (Signed)
PA for vascepa submitted via CMM Key: [id number]

## 2021-04-03 NOTE — Telephone Encounter (Signed)
Response in CMM portal  No PA required paid claim on DOS 04/03/2021

## 2021-04-06 ENCOUNTER — Ambulatory Visit
Admission: RE | Admit: 2021-04-06 | Discharge: 2021-04-06 | Disposition: A | Discharge status: Home / Self Care | Payer: BC Managed Care – PPO | Source: Ambulatory Visit | Attending: Family Medicine | Admitting: Family Medicine

## 2021-04-06 ENCOUNTER — Other Ambulatory Visit: Payer: Self-pay

## 2021-04-06 DIAGNOSIS — N6320 Unspecified lump in the left breast, unspecified quadrant: Secondary | ICD-10-CM

## 2021-04-06 DIAGNOSIS — R928 Other abnormal and inconclusive findings on diagnostic imaging of breast: Secondary | ICD-10-CM | POA: Diagnosis not present

## 2021-04-06 DIAGNOSIS — N6322 Unspecified lump in the left breast, upper inner quadrant: Secondary | ICD-10-CM | POA: Diagnosis not present

## 2021-04-19 DIAGNOSIS — Z79899 Other long term (current) drug therapy: Secondary | ICD-10-CM | POA: Diagnosis not present

## 2021-04-19 DIAGNOSIS — L4050 Arthropathic psoriasis, unspecified: Secondary | ICD-10-CM | POA: Diagnosis not present

## 2021-04-21 NOTE — Progress Notes (Deleted)
Name: Marcus Mitchell  Age/ Sex: 50 y.o., male   MRN/ DOB: [id number], 09-Feb-1971     PCP: Antony Contras, MD   Reason for Endocrinology Evaluation: Type 2 Diabetes Mellitus  Initial Endocrine Consultative Visit: 01/21/2019    PATIENT IDENTIFIER: Marcus Mitchell is a 50 y.o. male with a past medical history of T2DM , Hx of pancreatitis, Psoriatic arthritis  and Dyslipidemia. The patient has followed with Endocrinology clinic since 01/21/2019 for consultative assistance with management of his diabetes.  DIABETIC HISTORY:  [name] was diagnosed with T2DM many years ago,he has reported intolerance to Metformin. His hemoglobin A1c has ranged from 7.7% in 2019, peaking at 8.5% in 2020.   On his initial visit to our clinic he had an A1c of  8.2% ,And was on pioglitazone 30 mg daily , we started [place]  Metformin started 06/2019 but stopped in 2022 due to intolerance   Started Glipizide 12/2020  Mother with DM Denies ETOH use SUBJECTIVE:   During the last visit (12/21/2020): A1c 7.3 %.  continued  Farxiga and  pioglitazone and started Glipizide      Today (04/21/2021): [name] is here for a  follow up on diabetes management. He checks his blood sugars 0 times a day. Otherwise, the patient has not required any recent emergency interventions for hypoglycemia or hyperglycemia.     Has burning pain of the right arm  Recent poly arthralgia , on gabapentin and nortriptyline   Has been taking 50 % of Vascepa dose     HOME DIABETES REGIMEN:  Pioglitazone 30 mg daily  Farxiga 10 mg daily  Glipizide 5 mg daily      METER DOWNLOAD SUMMARY: No data on download      DIABETIC COMPLICATIONS: Microvascular complications:  Denies: retinopathy, CKD neuropathy  Last eye exam: Completed 2019     Macrovascular complications:   Denies: CAD, PVD, CVA    HISTORY:  Past Medical History:  Past Medical History:  Diagnosis Date   Chronic recurrent pancreatitis  ([place]) 08/05/2014   Diabetes mellitus type II, controlled ([place])    GERD (gastroesophageal reflux disease)    Hyperlipemia    Hypertension    Hypertriglyceridemia    Hypertriglyceridemia, familial 08/05/2014   Kidney stones    NAFLD (nonalcoholic fatty liver disease)    Pancreatitis, recurrent    Plaque psoriasis    Psoriatic arthritis ([place])    Past Surgical History:  Past Surgical History:  Procedure Laterality Date   CHOLECYSTECTOMY OPEN     DUODENITIS     ESOPHAGOGASTRODUODENOSCOPY     EUS N/A 07/20/2015   Procedure: UPPER ENDOSCOPIC ULTRASOUND (EUS) RADIAL;  Surgeon: Arta Silence, MD;  Location: [facility];  Service: Endoscopy;  Laterality: N/A;   Social History:  reports that he has quit smoking. His smoking use included cigarettes. He has never used smokeless tobacco. He reports that he does not drink alcohol and does not use drugs. Family History:  Family History  Problem Relation Age of Onset   Hypertension Mother    Diabetes Mother    Hyperlipidemia Father      HOME MEDICATIONS: Allergies as of 04/25/2021       Reactions   Nsaids Hives   Metformin And Related Diarrhea   Niacin And Related Other (See Comments)   flushing of face        Medication List        Accurate as of April 21, 2021  7:35 AM.  If you have any questions, ask your nurse or doctor.          cetirizine 10 MG tablet Commonly known as: ZYRTEC Take 10 mg by mouth daily.   Contour Next Monitor w/Device Kit 1 Device by Does not apply route daily.   Contour Next Test test strip Generic drug: glucose blood USE TO CHECK BLOOD SUGAR TWICE DAILY   dapagliflozin propanediol 10 MG Tabs tablet Commonly known as: Farxiga Take 1 tablet (10 mg total) by mouth daily.   fenofibrate micronized 134 MG capsule Commonly known as: LOFIBRA Take 134 mg by mouth daily. daily.   glipiZIDE 5 MG tablet Commonly known as: GLUCOTROL Take 1 tablet (5 mg total) by mouth daily before breakfast.    Humira Pen 40 MG/0.8ML Pnkt Generic drug: Adalimumab Inject 40 mg into the skin every 14 (fourteen) days.   Lancets Micro Thin 33G Misc USE AS DIRECTED TWICE DAILY   losartan 50 MG tablet Commonly known as: COZAAR Take 50 mg by mouth daily.   nortriptyline 10 MG capsule Commonly known as: PAMELOR Take by mouth 2 (two) times daily.   nortriptyline 25 MG capsule Commonly known as: PAMELOR Take by mouth.   omeprazole 20 MG tablet Commonly known as: PRILOSEC OTC Take 20 mg by mouth daily.   pioglitazone 30 MG tablet Commonly known as: ACTOS TAKE 1 TABLET(30 MG) BY MOUTH DAILY   rosuvastatin 20 MG tablet Commonly known as: CRESTOR Take 1 tablet (20 mg total) by mouth daily.   Vascepa 1 g capsule Generic drug: icosapent Ethyl TAKE 2 CAPSULES(2 GRAMS) BY MOUTH TWICE DAILY         OBJECTIVE:   Vital Signs: There were no vitals taken for this visit.  Wt Readings from Last 3 Encounters:  12/21/20 176 lb (79.8 kg)  01/04/20 177 lb (80.3 kg)  11/19/19 181 lb 12.8 oz (82.5 kg)     Exam: General: Pt appears well and is in NAD  Lungs: Clear with good BS bilat with no rales, rhonchi, or wheezes  Heart: RRR with normal S1 and S2 and no gallops; no murmurs; no rub  Abdomen: Normoactive bowel sounds, soft, nontender, without masses or organomegaly palpable  Extremities: No pretibial edema.  Neuro: MS is good with appropriate affect, pt is alert and Ox3        DATA REVIEWED:  Lab Results  Component Value Date   HGBA1C 7.3 (A) 12/21/2020   HGBA1C 6.8 (A) 11/19/2019   HGBA1C 7.5 (A) 07/01/2019   Results for Marcus Mitchell (MRN [id number]) as of 12/22/2020 09:58  Ref. Range 12/21/2020 08:46  Sodium Latest Ref Range: 135 - 145 mEq/L 136  Potassium Latest Ref Range: 3.5 - 5.1 mEq/L 4.3  Chloride Latest Ref Range: 96 - 112 mEq/L 102  CO2 Latest Ref Range: 19 - 32 mEq/L 24  Glucose Latest Ref Range: 70 - 99 mg/dL 138 (H)  BUN Latest Ref Range: 6 - 23 mg/dL 17   Creatinine Latest Ref Range: 0.40 - 1.50 mg/dL 1.00  Calcium Latest Ref Range: 8.4 - 10.5 mg/dL 9.2  Alkaline Phosphatase Latest Ref Range: 39 - 117 U/L 47  Albumin Latest Ref Range: 3.5 - 5.2 g/dL 4.6  AST Latest Ref Range: 0 - 37 U/L 20  ALT Latest Ref Range: 0 - 53 U/L 28  Total Protein Latest Ref Range: 6.0 - 8.3 g/dL 7.3  Total Bilirubin Latest Ref Range: 0.2 - 1.2 mg/dL 0.6  GFR Latest Ref Range: >60.00 mL/min 89.44  Total CHOL/HDL Ratio Unknown 5  Cholesterol Latest Ref Range: 0 - 200 mg/dL 184  HDL Cholesterol Latest Ref Range: >39.00 mg/dL 37.20 (L)  Direct LDL Latest Units: mg/dL 76.0  MICROALB/CREAT RATIO Latest Ref Range: 0.0 - 30.0 mg/g 0.9  NonHDL Unknown 147.15  Triglycerides Latest Ref Range: 0.0 - 149.0 mg/dL 325.0 (H)  VLDL Latest Ref Range: 0.0 - 40.0 mg/dL 65.0 (H)  VITD Latest Ref Range: 30.00 - 100.00 ng/mL 25.22 (L)  Vitamin B12 Latest Ref Range: 211 - 911 pg/mL 535  TSH Latest Ref Range: 0.35 - 5.50 uIU/mL 1.28  Creatinine,U Latest Units: mg/dL 81.3  Microalb, Ur Latest Ref Range: 0.0 - 1.9 mg/dL <0.7    ASSESSMENT / PLAN / RECOMMENDATIONS:   1) 1) Type 2 Diabetes Mellitus, sub-optimally controlled, Without complications - Most recent A1c of 7.3 %. Goal A1c < 7.0 %.      -Unable to tolerate metformin - He is not a candidate for GLP-1 agonists nor DPP-4 inhibitors due to risk of pancreatitis -I have recommended adding sulfonylurea, cautioned against hypoglycemia     MEDICATIONS: - Farxiga 10 mg daily  - Pioglitazone 30 mg daily  - start glipizide 5 mg, 1 tablet daily   EDUCATION / INSTRUCTIONS: BG monitoring instructions: Patient is instructed to check his blood sugars 1 times a day, fasting Call [facility] if: BG persistently < 70  I reviewed the Rule of 15 for the treatment of hypoglycemia in detail with the patient. Literature supplied.      3) Dyslipidemia/Hypertriglyceridemia: Patient is on a Crestor 10 mg , vascepa  and fenofibrate. We also discussed that cutting down on his CHO intake will improve his Tg levels.  He admits to forgetting to take the evening dose of the supper Lipid panel today shows optimal LDL level but TG is elevated, I expect this to improve if he start taking Vascepa as prescribed    Rosuvastatin 20 mg daily  Vascepa 2 g BID  Fenofibrate 134 mg daily    4) vitamin D insufficiency:  -We will advised the patient to start OTC vitamin D3 at 1000 IU daily      F/U in 6 months    Signed electronically by: Mack Guise, MD  [facility]  [address] [place], [place] [address] Phone: [phone] FAX: [phone]   CC: Antony Contras, [place] [address] Phone: [phone]  Fax: [phone]  Return to Endocrinology clinic as below: Future Appointments  Date Time Provider [place]  04/25/2021  8:30 AM Mily Malecki, Melanie Crazier, MD [facility]  08/11/2021 10:30 AM Hilty, Nadean Corwin, MD [facility]

## 2021-04-25 ENCOUNTER — Ambulatory Visit: Payer: BC Managed Care – PPO | Admitting: Internal Medicine

## 2021-04-25 ENCOUNTER — Other Ambulatory Visit: Payer: Self-pay

## 2021-04-26 NOTE — Progress Notes (Signed)
Name: Marcus Mitchell  Age/ Sex: 50 y.o., male   MRN/ DOB: [id number], 26-Apr-1971     PCP: Antony Contras, MD   Reason for Endocrinology Evaluation: Type 2 Diabetes Mellitus  Initial Endocrine Consultative Visit: 01/21/2019    PATIENT IDENTIFIER: Marcus Mitchell is a 50 y.o. male with a past medical history of T2DM , Hx of pancreatitis, Psoriatic arthritis  and Dyslipidemia. The patient has followed with Endocrinology clinic since 01/21/2019 for consultative assistance with management of his diabetes.  DIABETIC HISTORY:  Marcus Mitchell was diagnosed with T2DM many years ago,he has reported intolerance to Metformin. His hemoglobin A1c has ranged from 7.7% in 2019, peaking at 8.5% in 2020.   On his initial visit to our clinic he had an A1c of  8.2% ,And was on pioglitazone 30 mg daily , we started [place]  Metformin started 06/2019 but stopped in 2022 due to intolerance   Started Glipizide 12/2020  Mother with DM Denies ETOH use SUBJECTIVE:   During the last visit (12/21/2020): A1c 7.3 %.  continued  Farxiga and  pioglitazone and started Glipizide      Today (04/27/2021): Marcus Mitchell is here for a  follow up on diabetes management. He checks his blood sugars 0 times a day. Otherwise, the patient has not required any recent emergency interventions for hypoglycemia or hyperglycemia.    Follows with Rheumatology ([facility]) for psoriatic arthritis . Pt on Humira  He is on on gabapentin and nortriptyline for joint pain  He took Glipizide but caused subjective hypoglycemia around 10  am  so he stopped it .     HOME DIABETES REGIMEN:  Pioglitazone 30 mg daily  Farxiga 10 mg daily  Glipizide 5 mg daily      METER DOWNLOAD SUMMARY: 11/24-12/12/2020  Average Number Tests/Day = 0.5 Overall Mean FS Glucose = 148 Standard Deviation = 6  BG Ranges: Low = 140 High = 159   Hypoglycemic Events/30 Days: BG < 50 = 0 Episodes of symptomatic severe hypoglycemia =  0      DIABETIC COMPLICATIONS: Microvascular complications:  Denies: retinopathy, CKD neuropathy  Last eye exam: Completed 12/29/2020     Macrovascular complications:   Denies: CAD, PVD, CVA    HISTORY:  Past Medical History:  Past Medical History:  Diagnosis Date   Chronic recurrent pancreatitis ([place]) 08/05/2014   Diabetes mellitus type II, controlled ([place])    GERD (gastroesophageal reflux disease)    Hyperlipemia    Hypertension    Hypertriglyceridemia    Hypertriglyceridemia, familial 08/05/2014   Kidney stones    NAFLD (nonalcoholic fatty liver disease)    Pancreatitis, recurrent    Plaque psoriasis    Psoriatic arthritis ([place])    Past Surgical History:  Past Surgical History:  Procedure Laterality Date   CHOLECYSTECTOMY OPEN     DUODENITIS     ESOPHAGOGASTRODUODENOSCOPY     EUS N/A 07/20/2015   Procedure: UPPER ENDOSCOPIC ULTRASOUND (EUS) RADIAL;  Surgeon: Arta Silence, MD;  Location: [facility];  Service: Endoscopy;  Laterality: N/A;   Social History:  reports that he has quit smoking. His smoking use included cigarettes. He has never used smokeless tobacco. He reports that he does not drink alcohol and does not use drugs. Family History:  Family History  Problem Relation Age of Onset   Hypertension Mother    Diabetes Mother    Hyperlipidemia Father      HOME MEDICATIONS: Allergies as of 04/27/2021  Reactions   Nsaids Hives   Metformin And Related Diarrhea   Niacin And Related Other (See Comments)   flushing of face        Medication List        Accurate as of April 27, 2021 11:16 AM. If you have any questions, ask your nurse or doctor.          STOP taking these medications    nortriptyline 10 MG capsule Commonly known as: PAMELOR Stopped by: Dorita Sciara, MD   nortriptyline 25 MG capsule Commonly known as: PAMELOR Stopped by: Dorita Sciara, MD       TAKE these medications    cetirizine 10 MG  tablet Commonly known as: ZYRTEC Take 10 mg by mouth daily.   Contour Next Monitor w/Device Kit 1 Device by Does not apply route daily.   Contour Next Test test strip Generic drug: glucose blood USE TO CHECK BLOOD SUGAR TWICE DAILY   dapagliflozin propanediol 10 MG Tabs tablet Commonly known as: Farxiga Take 1 tablet (10 mg total) by mouth daily.   fenofibrate micronized 134 MG capsule Commonly known as: LOFIBRA Take 134 mg by mouth daily. daily.   gabapentin 300 MG capsule Commonly known as: NEURONTIN Take 300 mg by mouth 3 (three) times daily.   glipiZIDE 5 MG tablet Commonly known as: GLUCOTROL Take 1 tablet (5 mg total) by mouth daily before breakfast.   Humira Pen 40 MG/0.8ML Pnkt Generic drug: Adalimumab Inject 40 mg into the skin every 14 (fourteen) days.   Lancets Micro Thin 33G Misc USE AS DIRECTED TWICE DAILY   losartan 50 MG tablet Commonly known as: COZAAR Take 50 mg by mouth daily.   omeprazole 20 MG tablet Commonly known as: PRILOSEC OTC Take 20 mg by mouth daily.   pioglitazone 30 MG tablet Commonly known as: ACTOS TAKE 1 TABLET(30 MG) BY MOUTH DAILY   rosuvastatin 20 MG tablet Commonly known as: CRESTOR Take 1 tablet (20 mg total) by mouth daily.   Vascepa 1 g capsule Generic drug: icosapent Ethyl TAKE 2 CAPSULES(2 GRAMS) BY MOUTH TWICE DAILY   venlafaxine XR 37.5 MG 24 hr capsule Commonly known as: EFFEXOR-XR Take 37.5 mg by mouth daily.         OBJECTIVE:   Vital Signs: BP 126/72 (BP Location: Left Arm, Patient Position: Sitting, Cuff Size: Small)   Pulse 91   Ht '5\' 5"'  (1.651 m)   Wt 178 lb (80.7 kg)   SpO2 99%   BMI 29.62 kg/m   Wt Readings from Last 3 Encounters:  04/27/21 178 lb (80.7 kg)  12/21/20 176 lb (79.8 kg)  01/04/20 177 lb (80.3 kg)     Exam: General: Pt appears well and is in NAD  Lungs: Clear with good BS bilat with no rales, rhonchi, or wheezes  Heart: RRR with normal S1 and S2 and no gallops; no  murmurs; no rub  Abdomen: Normoactive bowel sounds, soft, nontender, without masses or organomegaly palpable  Extremities: No pretibial edema.  Neuro: MS is good with appropriate affect, pt is alert and Ox3    DM Foot exam 04/27/2021  The skin of the feet is intact without sores or ulcerations. The pedal pulses are 2+ on right and 2+ on left. The sensation is intact to a screening 5.07, 10 gram monofilament bilaterally    DATA REVIEWED:  Lab Results  Component Value Date   HGBA1C 7.3 (A) 12/21/2020   HGBA1C 6.8 (A) 11/19/2019   HGBA1C  7.5 (A) 07/01/2019   Results for Marcus Mitchell, Marcus Mitchell (MRN [id number]) as of 12/22/2020 09:58  Ref. Range 12/21/2020 08:46  Sodium Latest Ref Range: 135 - 145 mEq/L 136  Potassium Latest Ref Range: 3.5 - 5.1 mEq/L 4.3  Chloride Latest Ref Range: 96 - 112 mEq/L 102  CO2 Latest Ref Range: 19 - 32 mEq/L 24  Glucose Latest Ref Range: 70 - 99 mg/dL 138 (H)  BUN Latest Ref Range: 6 - 23 mg/dL 17  Creatinine Latest Ref Range: 0.40 - 1.50 mg/dL 1.00  Calcium Latest Ref Range: 8.4 - 10.5 mg/dL 9.2  Alkaline Phosphatase Latest Ref Range: 39 - 117 U/L 47  Albumin Latest Ref Range: 3.5 - 5.2 g/dL 4.6  AST Latest Ref Range: 0 - 37 U/L 20  ALT Latest Ref Range: 0 - 53 U/L 28  Total Protein Latest Ref Range: 6.0 - 8.3 g/dL 7.3  Total Bilirubin Latest Ref Range: 0.2 - 1.2 mg/dL 0.6  GFR Latest Ref Range: >60.00 mL/min 89.44  Total CHOL/HDL Ratio Unknown 5  Cholesterol Latest Ref Range: 0 - 200 mg/dL 184  HDL Cholesterol Latest Ref Range: >39.00 mg/dL 37.20 (L)  Direct LDL Latest Units: mg/dL 76.0  MICROALB/CREAT RATIO Latest Ref Range: 0.0 - 30.0 mg/g 0.9  NonHDL Unknown 147.15  Triglycerides Latest Ref Range: 0.0 - 149.0 mg/dL 325.0 (H)  VLDL Latest Ref Range: 0.0 - 40.0 mg/dL 65.0 (H)  VITD Latest Ref Range: 30.00 - 100.00 ng/mL 25.22 (L)  Vitamin B12 Latest Ref Range: 211 - 911 pg/mL 535  TSH Latest Ref Range: 0.35 - 5.50 uIU/mL 1.28  Creatinine,U Latest  Units: mg/dL 81.3  Microalb, Ur Latest Ref Range: 0.0 - 1.9 mg/dL <0.7    ASSESSMENT / PLAN / RECOMMENDATIONS:   1) 1) Type 2 Diabetes Mellitus, sub-optimally controlled, Without complications - Most recent A1c of 7.1 %. Goal A1c < 7.0 %.     -A1c down from 7.3% -Patient is intolerant metformin - He is not a candidate for GLP-1 agonists nor DPP-4 inhibitors due to risk of pancreatitis -I have started him on glipizide but he had to stop it due to subjective hypoglycemia, he kept feeling weak, there is no glucose checked during these episodes.  I have encouraged the patient in the future to try and check glucose during episodes of weakness to confirm hypoglycemia -We discussed the definition of hypoglycemia as a BG <70 mg/dL  -At this time we have opted to hold off on the glipizide, I did discuss that with him that we could restart at a smaller dose such as half a tablet before dinner rather than breakfast.  Dinner seems to be a bigger meal for him -We will increase pioglitazone as below     MEDICATIONS: -Continue Farxiga 10 mg daily  -Increase pioglitazone 45 mg daily  -Stop glipizide   EDUCATION / INSTRUCTIONS: BG monitoring instructions: Patient is instructed to check his blood sugars 1 times a day, fasting Call [place] if: BG persistently < 70  I reviewed the Rule of 15 for the treatment of hypoglycemia in detail with the patient. Literature supplied.      3) Dyslipidemia/Hypertriglyceridemia: Patient is on a Crestor 10 mg , vascepa and fenofibrate. -He has been doing better with compliance    Rosuvastatin 20 mg daily  Vascepa 2 g BID  Fenofibrate 134 mg daily    4) Arthralgias:  -He has multiple joint pains and aches, he does have psoriatic arthritis but apparently this is not  caused by this. -We did discuss statins as a cause for arthralgias, he was advised to hold it for 2 weeks, if his symptoms do not resolve he was advised to restart  rosuvastatin.  But if his joint pains resolve, patient to notify me so I can start him on a smaller dose of rosuvastatin such as 5 mg -We also discussed that if rosuvastatin does not help improve his joint pains he may try holding that [place] for 2 weeks and we will proceed from there  F/U in 4 months    Signed electronically by: Mack Guise, MD  [facility] Endocrinology  [address] [place], [place] [address] Phone: [phone] FAX: [phone]   CC: Antony Contras, MD [address] [place] [address] Phone: [phone]  Fax: [phone]  Return to Endocrinology clinic as below: Future Appointments  Date Time Provider [place]  08/11/2021 10:30 AM Hilty, Nadean Corwin, MD [facility]

## 2021-04-27 ENCOUNTER — Other Ambulatory Visit: Payer: Self-pay

## 2021-04-27 ENCOUNTER — Encounter: Payer: Self-pay | Admitting: Internal Medicine

## 2021-04-27 ENCOUNTER — Ambulatory Visit (INDEPENDENT_AMBULATORY_CARE_PROVIDER_SITE_OTHER): Payer: BC Managed Care – PPO | Admitting: Internal Medicine

## 2021-04-27 VITALS — BP 126/72 | HR 91 | Ht 65.0 in | Wt 178.0 lb

## 2021-04-27 DIAGNOSIS — M2550 Pain in unspecified joint: Secondary | ICD-10-CM | POA: Diagnosis not present

## 2021-04-27 DIAGNOSIS — E781 Pure hyperglyceridemia: Secondary | ICD-10-CM

## 2021-04-27 DIAGNOSIS — E119 Type 2 diabetes mellitus without complications: Secondary | ICD-10-CM | POA: Diagnosis not present

## 2021-04-27 LAB — POCT GLYCOSYLATED HEMOGLOBIN (HGB A1C): Hemoglobin A1C: 7.1 % — AB (ref 4.0–5.6)

## 2021-04-27 MED ORDER — PIOGLITAZONE HCL 45 MG PO TABS: 45.0000 mg | ORAL_TABLET | Freq: Every day | ORAL | 3 refills | Status: AC

## 2021-04-27 NOTE — Patient Instructions (Signed)
-   Stop Glipizide for now  - Continue farxiga 10 mg daily  - Increase  Pioglitazone 45 mg daily     You may hold Crestor ( Rosuvastatin ) for 2 weeks and see if it improved your pain. If it does please let me know so I can reduce the dose. If it does NOT help . Please Hold Marcelline Deist for 2 weeks and let me know if it helps. If it does not help, then go back on it     HOW TO TREAT LOW BLOOD SUGARS (Blood sugar LESS THAN 70 MG/DL) Please follow the RULE OF 15 for the treatment of hypoglycemia treatment (when your (blood sugars are less than 70 mg/dL)   STEP 1: Take 15 grams of carbohydrates when your blood sugar is low, which includes:  3-4 GLUCOSE TABS  OR 3-4 OZ OF JUICE OR REGULAR SODA OR ONE TUBE OF GLUCOSE GEL    STEP 2: RECHECK blood sugar in 15 MINUTES STEP 3: If your blood sugar is still low at the 15 minute recheck --> then, go back to STEP 1 and treat AGAIN with another 15 grams of carbohydrates.

## 2021-05-10 DIAGNOSIS — R059 Cough, unspecified: Secondary | ICD-10-CM | POA: Diagnosis not present

## 2021-05-10 DIAGNOSIS — U071 COVID-19: Secondary | ICD-10-CM | POA: Diagnosis not present

## 2021-05-18 ENCOUNTER — Telehealth: Payer: Self-pay | Admitting: Internal Medicine

## 2021-05-18 NOTE — Telephone Encounter (Signed)
Approved  Message from plan: Effective from 05/18/2021 through 05/17/2022

## 2021-05-18 NOTE — Telephone Encounter (Signed)
PA for vascepa submitted via CMM (Key: [id number]) Rx #: [id number]

## 2021-06-13 ENCOUNTER — Other Ambulatory Visit: Payer: Self-pay | Admitting: Internal Medicine

## 2021-06-14 DIAGNOSIS — G608 Other hereditary and idiopathic neuropathies: Secondary | ICD-10-CM | POA: Diagnosis not present

## 2021-07-20 DIAGNOSIS — R109 Unspecified abdominal pain: Secondary | ICD-10-CM | POA: Diagnosis not present

## 2021-07-20 DIAGNOSIS — M545 Low back pain, unspecified: Secondary | ICD-10-CM | POA: Diagnosis not present

## 2021-07-21 ENCOUNTER — Other Ambulatory Visit: Payer: Self-pay | Admitting: Gastroenterology

## 2021-07-21 DIAGNOSIS — K861 Other chronic pancreatitis: Secondary | ICD-10-CM | POA: Diagnosis not present

## 2021-07-21 DIAGNOSIS — R1012 Left upper quadrant pain: Secondary | ICD-10-CM | POA: Diagnosis not present

## 2021-08-11 ENCOUNTER — Other Ambulatory Visit: Payer: Self-pay

## 2021-08-11 ENCOUNTER — Ambulatory Visit (INDEPENDENT_AMBULATORY_CARE_PROVIDER_SITE_OTHER): Payer: BC Managed Care – PPO | Admitting: Internal Medicine

## 2021-08-11 ENCOUNTER — Encounter: Payer: Self-pay | Admitting: Internal Medicine

## 2021-08-11 VITALS — BP 118/86 | HR 117 | Ht 65.0 in | Wt 178.4 lb

## 2021-08-11 DIAGNOSIS — E781 Pure hyperglyceridemia: Secondary | ICD-10-CM | POA: Diagnosis not present

## 2021-08-11 DIAGNOSIS — R000 Tachycardia, unspecified: Secondary | ICD-10-CM | POA: Diagnosis not present

## 2021-08-11 DIAGNOSIS — K859 Acute pancreatitis without necrosis or infection, unspecified: Secondary | ICD-10-CM

## 2021-08-11 MED ORDER — FENOFIBRATE MICRONIZED 134 MG PO CAPS: 134.0000 mg | ORAL_CAPSULE | Freq: Every day | ORAL | 3 refills | Status: DC

## 2021-08-11 MED ORDER — ROSUVASTATIN CALCIUM 20 MG PO TABS: 20.0000 mg | ORAL_TABLET | Freq: Every day | ORAL | 3 refills | Status: AC

## 2021-08-11 MED ORDER — ICOSAPENT ETHYL 1 G PO CAPS: ORAL_CAPSULE | ORAL | 3 refills | Status: AC

## 2021-08-11 NOTE — Patient Instructions (Signed)
Medication Instructions:  ?Your physician recommends that you continue on your current medications as directed. Please refer to the Current Medication list given to you today. ? ?*If you need a refill on your cardiac medications before your next appointment, please call your pharmacy* ? ? ?Lab Work: ?FASTING lab work to check cholesterol ? ?If you have labs (blood work) drawn today and your tests are completely normal, you will receive your results only by: ?MyChart Message (if you have MyChart) OR ?A paper copy in the mail ?If you have any lab test that is abnormal or we need to change your treatment, we will call you to review the results. ? ? ?Testing/Procedures: ?NONE ? ? ?Follow-Up: ?At [facility], you and your health needs are our priority.  As part of our continuing mission to provide you with exceptional heart care, we have created designated Provider Care Teams.  These Care Teams include your primary Cardiologist (physician) and Advanced Practice Providers (APPs -  Physician Assistants and Nurse Practitioners) who all work together to provide you with the care you need, when you need it. ? ?We recommend signing up for the patient portal called "MyChart".  Sign up information is provided on this After Visit Summary.  MyChart is used to connect with patients for Virtual Visits (Telemedicine).  Patients are able to view lab/test results, encounter notes, upcoming appointments, etc.  Non-urgent messages can be sent to your provider as well.   ?To learn more about what you can do with MyChart, go to [email or web address].   ? ?Your next appointment:   ?6 month(s) ? ?The format for your next appointment:   ?In Person ? ?Provider:   ?Dr. Zoila Shutter ? ?

## 2021-08-11 NOTE — Progress Notes (Signed)
? ? ?LIPID CLINIC CONSULT NOTE ? ?Chief Complaint:  ?High triglycerides ? ?Primary Care Physician: ?Antony Contras, MD ? ?HPI:  ?Marcus Mitchell is a 51 y.o. male who is being seen today for the evaluation of high triglyceride at the request of Antony Contras, MD.  This is a very pleasant 51 year old male with a history of high triglycerides for more than 20 years.  He has had very difficult time controlling them and this is led partially toward pancreatitis which is been recurrent.  He is followed by Dr. Michail Sermon and only recently has had some improvement in his pancreatic attacks.  He was on metformin for diabetes and recently came off of that due to side effects and his hemoglobin A1c remains elevated over 7.  In addition he has risk factors including hypertension and a family history of hypertension and dyslipidemia.  Is unclear whether or triglyceride elevations run in his family but have been noted to be as high as in the 4000s in the past.  One point he was referred to see Dr. Harrington Challenger at [facility] in the lipid clinic and triglycerides were markedly elevated then.  Most recently though he has had some improvement in his numbers and as of August 2019, his total cholesterol was 204 triglycerides were 1307.  He has been managed on long-standing Lovaza 2 g twice daily as well as rosuvastatin 10 mg daily.  In addition he has psoriasis and psoriatic arthritis on Humira.  He has no known history of coronary artery disease. ? ?04/15/2018 ? ?[name] returns today for follow-up.  Unfortunately repeat labs recently have shown a marked increase in triglycerides to 2033, with total cholesterol 335 and direct LDL of 86.  He tells me that in the interim he came off of metformin due to intolerance of the medication.  This is important however because he had significant GI side effects which have completely cleared up.  IMA globin A1c not surprisingly is gone back up to 8.2.  He is on Lovaza.  We discussed management options  including continuing his fenofibrate.  I think he is a good candidate to add Actos.  This should help with his blood sugars as well as better triglyceride lowering the metformin.  In addition there is clear evidence that Vascepa is a preferred omega-3 compared to Lovaza and we will go ahead and switch him to that. ? ?07/10/2018 ? ?[name] returns today for follow-up.  He is doing very well on Vascepa.  He recently had a marked improvement in his dyslipidemia.  His total cholesterol now is 211, triglycerides of 678 (reduced from 2033), HDL 26 and direct LDL of 62.  He denies any side effects of the medications.  He continues on fenofibrate as well.  He is also on Actos and rosuvastatin 10 mg daily. ? ?08/11/2021 ? ?[name] returns today for follow-up of his high triglycerides.  He had done well for a while with triglycerides as low as in the 300s.  Previously they have been as high as in the 2000's.  Since starting Vascepa he has had no recurrent pancreatitis until very recently within the last few weeks.  He had been seen by GI and has a CT scan scheduled on March 28.  He is also had intermittent tachycardia.  He said he was given a Apple watch and has been getting notifications of tachycardia.  We did do an EKG today which shows a sinus tachycardia at 112.  Last lipid showed total cholesterol 205,  triglycerides 388, HDL 34 and LDL 106.  He is due for recent lipids again as this was in August 2022. ? ?PMHx:  ?Past Medical History:  ?Diagnosis Date  ? Chronic recurrent pancreatitis ([place]) 08/05/2014  ? Diabetes mellitus type II, controlled ([place])   ? GERD (gastroesophageal reflux disease)   ? Hyperlipemia   ? Hypertension   ? Hypertriglyceridemia   ? Hypertriglyceridemia, familial 08/05/2014  ? Kidney stones   ? NAFLD (nonalcoholic fatty liver disease)   ? Pancreatitis, recurrent   ? Plaque psoriasis   ? Psoriatic arthritis ([place])   ? ? ?Past Surgical History:  ?Procedure Laterality Date  ? CHOLECYSTECTOMY OPEN     ? DUODENITIS    ? ESOPHAGOGASTRODUODENOSCOPY    ? EUS N/A 07/20/2015  ? Procedure: UPPER ENDOSCOPIC ULTRASOUND (EUS) RADIAL;  Surgeon: Arta Silence, MD;  Location: [facility];  Service: Endoscopy;  Laterality: N/A;  ? ? ?FAMHx:  ?Family History  ?Problem Relation Age of Onset  ? Hypertension Mother   ? Diabetes Mother   ? Hyperlipidemia Father   ? ? ?SOCHx:  ? reports that he has quit smoking. His smoking use included cigarettes. He has never used smokeless tobacco. He reports that he does not drink alcohol and does not use drugs. ? ?ALLERGIES:  ?Allergies  ?Allergen Reactions  ? Nsaids Hives  ? Metformin And Related Diarrhea  ? Niacin And Related Other (See Comments)  ?  flushing of face  ? ? ?ROS: ?Pertinent items noted in HPI and remainder of comprehensive ROS otherwise negative. ? ?HOME MEDS: ?Current Outpatient Medications on File Prior to Visit  ?Medication Sig Dispense Refill  ? Blood Glucose Monitoring Suppl (CONTOUR NEXT MONITOR) w/Device KIT 1 Device by Does not apply route daily. 1 kit 0  ? cetirizine (ZYRTEC) 10 MG tablet Take 10 mg by mouth daily.    ? CONTOUR NEXT TEST test strip USE TO CHECK BLOOD SUGAR TWICE DAILY 100 strip 5  ? dapagliflozin propanediol (FARXIGA) 10 MG TABS tablet Take 1 tablet (10 mg total) by mouth daily. 90 tablet 3  ? HUMIRA PEN 40 MG/0.8ML PNKT Inject 40 mg into the skin every 14 (fourteen) days.   2  ? Lancets Micro Thin 33G MISC USE AS DIRECTED TWICE DAILY 100 each 6  ? losartan (COZAAR) 50 MG tablet Take 50 mg by mouth daily.    ? omeprazole (PRILOSEC OTC) 20 MG tablet Take 20 mg by mouth daily.     ? pioglitazone (ACTOS) 45 MG tablet Take 1 tablet (45 mg total) by mouth daily. 90 tablet 3  ? pregabalin (LYRICA) 50 MG capsule Take 50 mg by mouth 3 (three) times daily.    ? venlafaxine XR (EFFEXOR-XR) 37.5 MG 24 hr capsule Take 37.5 mg by mouth daily.    ? gabapentin (NEURONTIN) 300 MG capsule Take 300 mg by mouth 3 (three) times daily. (Patient not taking: Reported on  08/11/2021)    ? ?No current facility-administered medications on file prior to visit.  ? ? ?LABS/IMAGING: ?No results found for this or any previous visit (from the past 48 hour(s)). ?No results found. ? ?LIPID PANEL: ?   ?Component Value Date/Time  ? CHOL 205 (H) 01/03/2021 0806  ? TRIG 380 (H) 01/03/2021 0806  ? HDL 34 (L) 01/03/2021 0806  ? CHOLHDL 6.0 (H) 01/03/2021 7209  ? CHOLHDL 5 12/21/2020 0846  ? VLDL 65.0 (H) 12/21/2020 0846  ? LDLCALC 106 (H) 01/03/2021 4709  ? LDLDIRECT 76.0 12/21/2020 0846  ? ? ?  WEIGHTS: ?Wt Readings from Last 3 Encounters:  ?08/11/21 178 lb 6.4 oz (80.9 kg)  ?04/27/21 178 lb (80.7 kg)  ?12/21/20 176 lb (79.8 kg)  ? ? ?VITALS: ?BP 118/86   Pulse (!) 117   Ht _0  (1.651 m)   Wt 178 lb 6.4 oz (80.9 kg)   SpO2 98%   BMI 29.69 kg/m?  ? ?EXAM: ?Deferred ? ?EKG: ?Sinus tachycardia at 112-personally reviewed ? ?ASSESSMENT: ?Type V hypertriglyceridemia - recurrent pancreatitis ?Type 2 diabetes ?Psoriatic arthritis ?Tachycardia ? ?PLAN: ?1.   Mr. Boomer says that he had recent recurrent pancreatitis.  He has a CT scan scheduled for next week.  I am interested to see if he might have a pseudocyst or this could be con of chronic pancreatitis.  I think his triglycerides are probably still well controlled but we will repeat his lipids.  His tachycardia could be related to volume loss related to pancreatitis.  I will contact him with those lipid results and plan otherwise follow-up annually or sooner as necessary. ? ?Pixie Casino, MD, [facility], FACP  ?[place]  ?Medical Director of the Advanced Lipid Disorders &  ?Cardiovascular Risk Reduction Clinic ?Diplomate of the [employer] of Clinical Lipidology ?Attending Cardiologist  ?Direct Dial: [phone]  Fax: [phone]  ?Website:  [email or web address] ? ?Nadean Corwin Quamere Mussell ?08/11/2021, 1:00 PM ? ?

## 2021-08-15 ENCOUNTER — Ambulatory Visit
Admission: RE | Admit: 2021-08-15 | Discharge: 2021-08-15 | Disposition: A | Discharge status: Home / Self Care | Payer: BC Managed Care – PPO | Source: Ambulatory Visit | Attending: Gastroenterology | Admitting: Gastroenterology

## 2021-08-15 DIAGNOSIS — K861 Other chronic pancreatitis: Secondary | ICD-10-CM

## 2021-08-15 DIAGNOSIS — E781 Pure hyperglyceridemia: Secondary | ICD-10-CM | POA: Diagnosis not present

## 2021-08-15 DIAGNOSIS — K760 Fatty (change of) liver, not elsewhere classified: Secondary | ICD-10-CM | POA: Diagnosis not present

## 2021-08-15 MED ORDER — IOPAMIDOL (ISOVUE-300) INJECTION 61%
100.0000 mL | Freq: Once | INTRAVENOUS | Status: AC | PRN
  Administered 2021-08-15: 100 mL via INTRAVENOUS

## 2021-08-16 LAB — LIPID PANEL
Chol/HDL Ratio: 6.1 ratio — ABNORMAL HIGH (ref 0.0–5.0)
Cholesterol, Total: 207 mg/dL — ABNORMAL HIGH (ref 100–199)
HDL: 34 mg/dL — ABNORMAL LOW (ref >39)
LDL Chol Calc (NIH): 104 mg/dL — ABNORMAL HIGH (ref 0–99)
Triglycerides: 408 mg/dL — ABNORMAL HIGH (ref 0–149)
VLDL Cholesterol Cal: 69 mg/dL — ABNORMAL HIGH (ref 5–40)

## 2021-08-16 LAB — LDL CHOLESTEROL, DIRECT: LDL Direct: 86 mg/dL (ref 0–99)

## 2021-08-29 ENCOUNTER — Ambulatory Visit: Payer: BC Managed Care – PPO | Admitting: Internal Medicine

## 2021-08-30 ENCOUNTER — Encounter: Payer: Self-pay | Admitting: Internal Medicine

## 2021-09-14 DIAGNOSIS — G608 Other hereditary and idiopathic neuropathies: Secondary | ICD-10-CM | POA: Diagnosis not present

## 2021-09-14 DIAGNOSIS — E119 Type 2 diabetes mellitus without complications: Secondary | ICD-10-CM | POA: Diagnosis not present

## 2021-09-19 DIAGNOSIS — Z79899 Other long term (current) drug therapy: Secondary | ICD-10-CM | POA: Diagnosis not present

## 2021-09-19 DIAGNOSIS — M160 Bilateral primary osteoarthritis of hip: Secondary | ICD-10-CM | POA: Diagnosis not present

## 2021-09-19 DIAGNOSIS — L4050 Arthropathic psoriasis, unspecified: Secondary | ICD-10-CM | POA: Diagnosis not present

## 2021-09-19 DIAGNOSIS — M25552 Pain in left hip: Secondary | ICD-10-CM | POA: Diagnosis not present

## 2021-09-19 DIAGNOSIS — M25551 Pain in right hip: Secondary | ICD-10-CM | POA: Diagnosis not present

## 2021-09-28 ENCOUNTER — Encounter: Payer: Self-pay | Admitting: Internal Medicine

## 2021-09-28 ENCOUNTER — Ambulatory Visit (INDEPENDENT_AMBULATORY_CARE_PROVIDER_SITE_OTHER): Payer: BC Managed Care – PPO | Admitting: Internal Medicine

## 2021-09-28 VITALS — BP 120/76 | HR 75 | Ht 65.0 in | Wt 177.0 lb

## 2021-09-28 DIAGNOSIS — E119 Type 2 diabetes mellitus without complications: Secondary | ICD-10-CM

## 2021-09-28 DIAGNOSIS — E1165 Type 2 diabetes mellitus with hyperglycemia: Secondary | ICD-10-CM

## 2021-09-28 DIAGNOSIS — R739 Hyperglycemia, unspecified: Secondary | ICD-10-CM

## 2021-09-28 DIAGNOSIS — E781 Pure hyperglyceridemia: Secondary | ICD-10-CM | POA: Diagnosis not present

## 2021-09-28 LAB — POCT GLUCOSE (DEVICE FOR HOME USE): Glucose Fasting, POC: 165 mg/dL — AB (ref 70–99)

## 2021-09-28 LAB — POCT GLYCOSYLATED HEMOGLOBIN (HGB A1C): Hemoglobin A1C: 7.3 % — AB (ref 4.0–5.6)

## 2021-09-28 MED ORDER — GLIMEPIRIDE 1 MG PO TABS: 1.0000 mg | ORAL_TABLET | Freq: Every day | ORAL | 1 refills | Status: DC

## 2021-09-28 NOTE — Patient Instructions (Signed)
-   Start Glimepiride 1 mg , 1 tablet before Breakfast  ?- Continue farxiga 10 mg daily  ?- Continue Pioglitazone 45 mg daily  ? ? ? ? ? ? ? ?HOW TO TREAT LOW BLOOD SUGARS (Blood sugar LESS THAN 70 MG/DL) ?Please follow the RULE OF 15 for the treatment of hypoglycemia treatment (when your (blood sugars are less than 70 mg/dL)  ? ?STEP 1: Take 15 grams of carbohydrates when your blood sugar is low, which includes:  ?3-4 GLUCOSE TABS  OR ?3-4 OZ OF JUICE OR REGULAR SODA OR ?ONE TUBE OF GLUCOSE GEL   ? ?STEP 2: RECHECK blood sugar in 15 MINUTES ?STEP 3: If your blood sugar is still low at the 15 minute recheck --> then, go back to STEP 1 and treat AGAIN with another 15 grams of carbohydrates. ? ?

## 2021-09-28 NOTE — Progress Notes (Signed)
?  ? ? ?Name: Marcus Mitchell  ?Age/ Sex: 51 y.o., male   ?MRN/ DOB: [id number], 1970/10/31    ? ?PCP: Antony Contras, MD   ?Reason for Endocrinology Evaluation: Type 2 Diabetes Mellitus  ?Initial Endocrine Consultative Visit: 01/21/2019  ? ? ?PATIENT IDENTIFIER: Marcus Mitchell is a 51 y.o. male with a past medical history of T2DM , Hx of pancreatitis, Psoriatic arthritis  and Dyslipidemia. The patient has followed with Endocrinology clinic since 01/21/2019 for consultative assistance with management of his diabetes. ? ?DIABETIC HISTORY:  ?Marcus Mitchell was diagnosed with T2DM many years ago,he has reported intolerance to Metformin. His hemoglobin A1c has ranged from 7.7% in 2019, peaking at 8.5% in 2020.  ? ?On his initial visit to our clinic he had an A1c of  8.2% ,And was on pioglitazone 30 mg daily , we started [place] ? ?Metformin started 06/2019 but stopped in 2022 due to intolerance  ? ?Started Glipizide 12/2020 but this was stopped by December 2022 due to subjective hypoglycemia and reporting weakness with no concomitant glucose readings. ? ?Mother with DM ? ?SUBJECTIVE:  ? ?During the last visit (04/27/2021): A1c 7.1 %.  continued  Farxiga and  pioglitazone and stop glipizide ? ? ? ? ?Today (09/28/2021): Marcus Mitchell is here for a  follow up on diabetes management. He checks his blood sugars 1 times a day. Otherwise, the patient has not required any recent emergency interventions for hypoglycemia or hyperglycemia.  ? ? ?Follows with Rheumatology ([facility]) for psoriatic arthritis . Pt on Humira  ?He is on on gabapentin and nortriptyline for joint pain ? ? ?He had the stomach bug two weeks  ?Denies LE edema  ? ?HOME DIABETES REGIMEN:  ?Pioglitazone 45 mg daily  ?Farxiga 10 mg daily  ? ? ? ? ? ?METER DOWNLOAD SUMMARY:  ?120-165  ? ? ? ? ? ? ?DIABETIC COMPLICATIONS: ?Microvascular complications: ? ?Denies: retinopathy, CKD neuropathy  ?Last eye exam: Completed 12/29/2020 ?  ?  ?Macrovascular complications: ?   ?Denies: CAD, PVD, CVA ?  ? ?HISTORY:  ?Past Medical History:  ?Past Medical History:  ?Diagnosis Date  ? Chronic recurrent pancreatitis ([place]) 08/05/2014  ? Diabetes mellitus type II, controlled ([place])   ? GERD (gastroesophageal reflux disease)   ? Hyperlipemia   ? Hypertension   ? Hypertriglyceridemia   ? Hypertriglyceridemia, familial 08/05/2014  ? Kidney stones   ? NAFLD (nonalcoholic fatty liver disease)   ? Pancreatitis, recurrent   ? Plaque psoriasis   ? Psoriatic arthritis ([place])   ? ?Past Surgical History:  ?Past Surgical History:  ?Procedure Laterality Date  ? CHOLECYSTECTOMY OPEN    ? DUODENITIS    ? ESOPHAGOGASTRODUODENOSCOPY    ? EUS N/A 07/20/2015  ? Procedure: UPPER ENDOSCOPIC ULTRASOUND (EUS) RADIAL;  Surgeon: Arta Silence, MD;  Location: [facility];  Service: Endoscopy;  Laterality: N/A;  ? ?Social History:  reports that he has quit smoking. His smoking use included cigarettes. He has never used smokeless tobacco. He reports that he does not drink alcohol and does not use drugs. ?Family History:  ?Family History  ?Problem Relation Age of Onset  ? Hypertension Mother   ? Diabetes Mother   ? Hyperlipidemia Father   ? ? ? ?HOME MEDICATIONS: ?Allergies as of 09/28/2021   ? ?   Reactions  ? Nsaids Hives  ? Metformin And Related Diarrhea  ? Niacin And Related Other (See Comments)  ? flushing of face  ? ?  ? ?  ?  Medication List  ?  ? ?  ? Accurate as of Sep 28, 2021  8:31 AM. If you have any questions, ask your nurse or doctor.  ?  ?  ? ?  ? ?STOP taking these medications   ? ?gabapentin 300 MG capsule ?Commonly known as: NEURONTIN ?Stopped by: Dorita Sciara, MD ?  ? ?  ? ?TAKE these medications   ? ?cetirizine 10 MG tablet ?Commonly known as: ZYRTEC ?Take 10 mg by mouth daily. ?  ?Contour Next Monitor w/Device Kit ?1 Device by Does not apply route daily. ?  ?Contour Next Test test strip ?Generic drug: glucose blood ?USE TO CHECK BLOOD SUGAR TWICE DAILY ?  ?dapagliflozin propanediol 10 MG Tabs  tablet ?Commonly known as: [place] ?Take 1 tablet (10 mg total) by mouth daily. ?  ?fenofibrate micronized 134 MG capsule ?Commonly known as: LOFIBRA ?Take 1 capsule (134 mg total) by mouth daily. daily. ?  ?Humira Pen 40 MG/0.8ML Pnkt ?Generic drug: Adalimumab ?Inject 40 mg into the skin every 14 (fourteen) days. ?  ?icosapent Ethyl 1 g capsule ?Commonly known as: Vascepa ?TAKE 2 CAPSULES(2 GRAMS) BY MOUTH TWICE DAILY ?  ?Lancets Micro Thin 33G Misc ?USE AS DIRECTED TWICE DAILY ?  ?losartan 50 MG tablet ?Commonly known as: COZAAR ?Take 50 mg by mouth daily. ?  ?omeprazole 20 MG tablet ?Commonly known as: PRILOSEC OTC ?Take 20 mg by mouth daily. ?  ?pioglitazone 45 MG tablet ?Commonly known as: Actos ?Take 1 tablet (45 mg total) by mouth daily. ?  ?pregabalin 25 MG capsule ?Commonly known as: LYRICA ?Take 25 mg by mouth 2 (two) times daily. ?  ?pregabalin 50 MG capsule ?Commonly known as: LYRICA ?Take 50 mg by mouth 3 (three) times daily. ?  ?rosuvastatin 20 MG tablet ?Commonly known as: CRESTOR ?Take 1 tablet (20 mg total) by mouth daily. ?  ?venlafaxine XR 37.5 MG 24 hr capsule ?Commonly known as: EFFEXOR-XR ?Take 37.5 mg by mouth daily. ?  ? ?  ? ? ? ?OBJECTIVE:  ? ?Vital Signs: BP 120/76 (BP Location: Left Arm, Patient Position: Sitting, Cuff Size: Small)   Pulse 75   Ht 5' 5" (1.651 m)   Wt 177 lb (80.3 kg)   SpO2 99%   BMI 29.45 kg/m?   ?Wt Readings from Last 3 Encounters:  ?09/28/21 177 lb (80.3 kg)  ?08/11/21 178 lb 6.4 oz (80.9 kg)  ?04/27/21 178 lb (80.7 kg)  ? ? ? ?Exam: ?General: Pt appears well and is in NAD  ?Lungs: Clear with good BS bilat with no rales, rhonchi, or wheezes  ?Heart: RRR with normal S1 and S2 and no gallops; no murmurs; no rub  ?Abdomen: Normoactive bowel sounds, soft, nontender, without masses or organomegaly palpable  ?Extremities: No pretibial edema.  ?Neuro: MS is good with appropriate affect, pt is alert and Ox3  ? ? ?DM Foot exam 04/27/2021 ? ?The skin of the feet is intact  without sores or ulcerations. ?The pedal pulses are 2+ on right and 2+ on left. ?The sensation is intact to a screening 5.07, 10 gram monofilament bilaterally ? ? ? ?DATA REVIEWED: ? ?Lab Results  ?Component Value Date  ? HGBA1C 7.3 (A) 09/28/2021  ? HGBA1C 7.1 (A) 04/27/2021  ? HGBA1C 7.3 (A) 12/21/2020  ? ? Latest Reference Range & Units 08/15/21 08:03  ?Total CHOL/HDL Ratio 0.0 - 5.0 ratio 6.1 (H)  ?Cholesterol, Total 100 - 199 mg/dL 207 (H)  ?HDL Cholesterol >39 mg/dL 34 (L)  ?  Direct LDL 0 - 99 mg/dL 86  ?Triglycerides 0 - 149 mg/dL 408 (H)  ?VLDL Cholesterol Cal 5 - 40 mg/dL 69 (H)  ?LDL Chol Calc (NIH) 0 - 99 mg/dL 104 (H)  ? ? ? ? ?ASSESSMENT / PLAN / RECOMMENDATIONS:  ? ?1) 1) Type 2 Diabetes Mellitus, sub-optimally controlled, Without complications - Most recent A1c of 7.1 %. Goal A1c < 7.0 %.   ?  ?-A1c stable  ?-Patient is intolerant metformin ?- He is not a candidate for GLP-1 agonists nor DPP-4 inhibitors due to risk of pancreatitis ?-I have started him on glipizide but he had to stop it due to subjective hypoglycemia,  ?-We discussed the definition of hypoglycemia as a BG <70 mg/dL  ?-Increasing Pioglitazone did not improve glycemic control  ?- He agreed to starting Glimepiride, discussed mechanism of action , cautioned against hypoglycemia ,we discussed if this happens , I would recommend reducing pioglitazone rather that stopping glimepiride  ?- I suspect he has low endogenous insulin production due to hx of pancreatitis  ? ? ? ? ?MEDICATIONS: ?-Continue Farxiga 10 mg daily  ?-Continue  pioglitazone 45 mg daily  ?-Start Glimepiride 1 mg, daily  ? ? ?EDUCATION / INSTRUCTIONS: ?BG monitoring instructions: Patient is instructed to check his blood sugars 1 times a day, fasting ?Call [facility] if: BG persistently < 70  ?I reviewed the Rule of 15 for the treatment of hypoglycemia in detail with the patient. Literature supplied. ? ? ? ? ? ?3) Dyslipidemia/Hypertriglyceridemia ? ?- LDL  trended up and Tg elevated at 408 mg/dL  ?-He assures me compliance with his lipid agents  ?- Discussed importance of following a low fat diet  ?- Discussed risk of pancreatitis with Tg > 400 mg/dL  ?  ?Medication  ?Rosuv

## 2021-10-06 DIAGNOSIS — M129 Arthropathy, unspecified: Secondary | ICD-10-CM | POA: Diagnosis not present

## 2021-10-06 DIAGNOSIS — L4050 Arthropathic psoriasis, unspecified: Secondary | ICD-10-CM | POA: Diagnosis not present

## 2021-10-06 DIAGNOSIS — E119 Type 2 diabetes mellitus without complications: Secondary | ICD-10-CM | POA: Diagnosis not present

## 2021-10-06 DIAGNOSIS — Z7984 Long term (current) use of oral hypoglycemic drugs: Secondary | ICD-10-CM | POA: Diagnosis not present

## 2021-10-27 ENCOUNTER — Encounter: Payer: Self-pay | Admitting: *Deleted

## 2021-10-27 DIAGNOSIS — Z006 Encounter for examination for normal comparison and control in clinical research program: Secondary | ICD-10-CM

## 2021-10-27 NOTE — Research (Signed)
Message left for Marcus Mitchell about essence research. Encouraged him to call back.

## 2021-11-23 DIAGNOSIS — H6123 Impacted cerumen, bilateral: Secondary | ICD-10-CM | POA: Diagnosis not present

## 2021-11-23 DIAGNOSIS — I10 Essential (primary) hypertension: Secondary | ICD-10-CM | POA: Diagnosis not present

## 2022-01-25 DIAGNOSIS — J019 Acute sinusitis, unspecified: Secondary | ICD-10-CM | POA: Diagnosis not present

## 2022-02-05 DIAGNOSIS — Z79899 Other long term (current) drug therapy: Secondary | ICD-10-CM | POA: Diagnosis not present

## 2022-02-05 DIAGNOSIS — L4050 Arthropathic psoriasis, unspecified: Secondary | ICD-10-CM | POA: Diagnosis not present

## 2022-02-05 DIAGNOSIS — R202 Paresthesia of skin: Secondary | ICD-10-CM | POA: Diagnosis not present

## 2022-03-13 DIAGNOSIS — G518 Other disorders of facial nerve: Secondary | ICD-10-CM | POA: Diagnosis not present

## 2022-03-13 DIAGNOSIS — R292 Abnormal reflex: Secondary | ICD-10-CM | POA: Diagnosis not present

## 2022-03-13 DIAGNOSIS — R208 Other disturbances of skin sensation: Secondary | ICD-10-CM | POA: Diagnosis not present

## 2022-03-13 DIAGNOSIS — G608 Other hereditary and idiopathic neuropathies: Secondary | ICD-10-CM | POA: Diagnosis not present

## 2022-03-20 DIAGNOSIS — M7711 Lateral epicondylitis, right elbow: Secondary | ICD-10-CM | POA: Diagnosis not present

## 2022-03-23 ENCOUNTER — Encounter: Payer: Self-pay | Admitting: *Deleted

## 2022-03-23 ENCOUNTER — Ambulatory Visit: Payer: BC Managed Care – PPO | Attending: Internal Medicine | Admitting: Internal Medicine

## 2022-03-23 ENCOUNTER — Encounter: Payer: Self-pay | Admitting: Internal Medicine

## 2022-03-23 VITALS — BP 114/70 | HR 71 | Ht 65.0 in | Wt 189.8 lb

## 2022-03-23 DIAGNOSIS — E119 Type 2 diabetes mellitus without complications: Secondary | ICD-10-CM

## 2022-03-23 DIAGNOSIS — Z006 Encounter for examination for normal comparison and control in clinical research program: Secondary | ICD-10-CM

## 2022-03-23 DIAGNOSIS — E781 Pure hyperglyceridemia: Secondary | ICD-10-CM

## 2022-03-23 NOTE — Patient Instructions (Signed)
Medication Instructions:  NO CHANGES  *If you need a refill on your cardiac medications before your next appointment, please call your pharmacy*   Lab Work: Lipid Panel and Direct LDL today  If you have labs (blood work) drawn today and your tests are completely normal, you will receive your results only by: [place] (if you have MyChart) OR A paper copy in the mail If you have any lab test that is abnormal or we need to change your treatment, we will call you to review the results.    Follow-Up: At [facility], you and your health needs are our priority.  As part of our continuing mission to provide you with exceptional heart care, we have created designated Provider Care Teams.  These Care Teams include your primary Cardiologist (physician) and Advanced Practice Providers (APPs -  Physician Assistants and Nurse Practitioners) who all work together to provide you with the care you need, when you need it.  We recommend signing up for the patient portal called "MyChart".  Sign up information is provided on this After Visit Summary.  MyChart is used to connect with patients for Virtual Visits (Telemedicine).  Patients are able to view lab/test results, encounter notes, upcoming appointments, etc.  Non-urgent messages can be sent to your provider as well.   To learn more about what you can do with MyChart, go to [email or web address].    Your next appointment:    1 year with Dr. Debara Pickett

## 2022-03-23 NOTE — Research (Signed)
Message left for Mr Marcus Mitchell about [employer], multimedia.encouraged him to call me back.

## 2022-03-23 NOTE — Progress Notes (Signed)
LIPID CLINIC CONSULT NOTE  Chief Complaint:  High triglycerides  Primary Care Physician: Antony Contras, MD  HPI:  Marcus Mitchell is a 51 y.o. male who is being seen today for the evaluation of high triglyceride at the request of Antony Contras, MD.  This is a very pleasant 51 year old male with a history of high triglycerides for more than 20 years.  He has had very difficult time controlling them and this is led partially toward pancreatitis which is been recurrent.  He is followed by Dr. Michail Sermon and only recently has had some improvement in his pancreatic attacks.  He was on metformin for diabetes and recently came off of that due to side effects and his hemoglobin A1c remains elevated over 7.  In addition he has risk factors including hypertension and a family history of hypertension and dyslipidemia.  Is unclear whether or triglyceride elevations run in his family but have been noted to be as high as in the 4000s in the past.  One point he was referred to see Dr. Harrington Challenger at [facility] in the lipid clinic and triglycerides were markedly elevated then.  Most recently though he has had some improvement in his numbers and as of August 2019, his total cholesterol was 204 triglycerides were 1307.  He has been managed on long-standing Lovaza 2 g twice daily as well as rosuvastatin 10 mg daily.  In addition he has psoriasis and psoriatic arthritis on Humira.  He has no known history of coronary artery disease.  04/15/2018  Marcus Mitchell returns today for follow-up.  Unfortunately repeat labs recently have shown a marked increase in triglycerides to 2033, with total cholesterol 335 and direct LDL of 86.  He tells me that in the interim he came off of metformin due to intolerance of the medication.  This is important however because he had significant GI side effects which have completely cleared up.  IMA globin A1c not surprisingly is gone back up to 8.2.  He is on Lovaza.  We discussed management options  including continuing his fenofibrate.  I think he is a good candidate to add Actos.  This should help with his blood sugars as well as better triglyceride lowering the metformin.  In addition there is clear evidence that Vascepa is a preferred omega-3 compared to Lovaza and we will go ahead and switch him to that.  07/10/2018  Marcus Mitchell returns today for follow-up.  He is doing very well on Vascepa.  He recently had a marked improvement in his dyslipidemia.  His total cholesterol now is 211, triglycerides of 678 (reduced from 2033), HDL 26 and direct LDL of 62.  He denies any side effects of the medications.  He continues on fenofibrate as well.  He is also on Actos and rosuvastatin 10 mg daily.  08/11/2021  Marcus Mitchell returns today for follow-up of his high triglycerides.  He had done well for a while with triglycerides as low as in the 300s.  Previously they have been as high as in the 2000's.  Since starting Vascepa he has had no recurrent pancreatitis until very recently within the last few weeks.  He had been seen by GI and has a CT scan scheduled on March 28.  He is also had intermittent tachycardia.  He said he was given a Apple watch and has been getting notifications of tachycardia.  We did do an EKG today which shows a sinus tachycardia at 112.  Last lipid showed total cholesterol 205,  triglycerides 388, HDL 34 and LDL 106.  He is due for recent lipids again as this was in August 2022.  03/23/2022  Marcus Mitchell returns today for follow-up of his lipids.  I had referred him previously for clinical trial regarding his triglycerides however he ultimately had not participated.  He is still considering this.  He is due for repeat labs.  He is fasting today.  He noted that his insurance is changing and he may not be able to continue to follow-up with [place].  PMHx:  Past Medical History:  Diagnosis Date   Chronic recurrent pancreatitis ([place]) 08/05/2014   Diabetes mellitus type II, controlled ([place])     GERD (gastroesophageal reflux disease)    Hyperlipemia    Hypertension    Hypertriglyceridemia    Hypertriglyceridemia, familial 08/05/2014   Kidney stones    NAFLD (nonalcoholic fatty liver disease)    Pancreatitis, recurrent    Plaque psoriasis    Psoriatic arthritis ([place])     Past Surgical History:  Procedure Laterality Date   CHOLECYSTECTOMY OPEN     DUODENITIS     ESOPHAGOGASTRODUODENOSCOPY     EUS N/A 07/20/2015   Procedure: UPPER ENDOSCOPIC ULTRASOUND (EUS) RADIAL;  Surgeon: Arta Silence, MD;  Location: [facility];  Service: Endoscopy;  Laterality: N/A;    FAMHx:  Family History  Problem Relation Age of Onset   Hypertension Mother    Diabetes Mother    Hyperlipidemia Father     SOCHx:   reports that he has quit smoking. His smoking use included cigarettes. He has never used smokeless tobacco. He reports that he does not drink alcohol and does not use drugs.  ALLERGIES:  Allergies  Allergen Reactions   Nsaids Hives   Metformin And Related Diarrhea   Niacin And Related Other (See Comments)    flushing of face    ROS: Pertinent items noted in HPI and remainder of comprehensive ROS otherwise negative.  HOME MEDS: Current Outpatient Medications on File Prior to Visit  Medication Sig Dispense Refill   Blood Glucose Monitoring Suppl (CONTOUR NEXT MONITOR) w/Device KIT 1 Device by Does not apply route daily. 1 kit 0   cetirizine (ZYRTEC) 10 MG tablet Take 10 mg by mouth daily.     CONTOUR NEXT TEST test strip USE TO CHECK BLOOD SUGAR TWICE DAILY 100 strip 5   dapagliflozin propanediol (FARXIGA) 10 MG TABS tablet Take 1 tablet (10 mg total) by mouth daily. 90 tablet 3   fenofibrate micronized (LOFIBRA) 134 MG capsule Take 1 capsule (134 mg total) by mouth daily. daily. 90 capsule 3   folic acid (FOLVITE) 1 MG tablet Take 1 mg by mouth daily.     glimepiride (AMARYL) 1 MG tablet Take 1 tablet (1 mg total) by mouth daily with breakfast. 90 tablet 1   HUMIRA PEN  40 MG/0.8ML PNKT Inject 40 mg into the skin every 14 (fourteen) days.   2   icosapent Ethyl (VASCEPA) 1 g capsule TAKE 2 CAPSULES(2 GRAMS) BY MOUTH TWICE DAILY 360 capsule 3   Lancets Micro Thin 33G MISC USE AS DIRECTED TWICE DAILY 100 each 6   losartan (COZAAR) 50 MG tablet Take 50 mg by mouth daily.     meloxicam (MOBIC) 15 MG tablet Take 15 mg by mouth daily.     methotrexate (RHEUMATREX) 2.5 MG tablet Take 2.5 mg by mouth once a week. Take 6 tablets     omeprazole (PRILOSEC OTC) 20 MG tablet Take 20 mg by mouth  daily.      pioglitazone (ACTOS) 45 MG tablet Take 1 tablet (45 mg total) by mouth daily. 90 tablet 3   pregabalin (LYRICA) 50 MG capsule Take 50 mg by mouth 3 (three) times daily.     rosuvastatin (CRESTOR) 20 MG tablet Take 1 tablet (20 mg total) by mouth daily. 90 tablet 3   pregabalin (LYRICA) 25 MG capsule Take 25 mg by mouth 2 (two) times daily. (Patient not taking: Reported on 03/23/2022)     venlafaxine XR (EFFEXOR-XR) 37.5 MG 24 hr capsule Take 37.5 mg by mouth daily. (Patient not taking: Reported on 03/23/2022)     No current facility-administered medications on file prior to visit.    LABS/IMAGING: No results found for this or any previous visit (from the past 48 hour(s)). No results found.  LIPID PANEL:    Component Value Date/Time   CHOL 207 (H) 08/15/2021 0803   TRIG 408 (H) 08/15/2021 0803   HDL 34 (L) 08/15/2021 0803   CHOLHDL 6.1 (H) 08/15/2021 0803   CHOLHDL 5 12/21/2020 0846   VLDL 65.0 (H) 12/21/2020 0846   LDLCALC 104 (H) 08/15/2021 0803   LDLDIRECT 86 08/15/2021 0803   LDLDIRECT 76.0 12/21/2020 0846    WEIGHTS: Wt Readings from Last 3 Encounters:  03/23/22 189 lb 12.8 oz (86.1 kg)  09/28/21 177 lb (80.3 kg)  08/11/21 178 lb 6.4 oz (80.9 kg)    VITALS: BP 114/70   Pulse 71   Ht 5' 5" (1.651 m)   Wt 189 lb 12.8 oz (86.1 kg)   SpO2 96%   BMI 31.58 kg/m   EXAM: Deferred  EKG: Deferred  ASSESSMENT: Type V hypertriglyceridemia -  recurrent pancreatitis Type 2 diabetes - A1c 7.2% Psoriatic arthritis Tachycardia  PLAN: 1.   Marcus Mitchell is fasting today.  He is due for repeat lipids.  We will get those drawn and consider options.  He is already on a good regimen of medications.  We may need to again consider clinical trial for him.  He initially was considered for CORE but did not follow through with this.  He might be a candidate for [facility]SSENCE. Will reach back out to the research staff to contact him.  Otherwise follow-up annually or sooner as necessary.  Pixie Casino, MD, [facility], [place] Director of the Advanced Lipid Disorders &  Cardiovascular Risk Reduction Clinic Diplomate of the American Board of Clinical Lipidology Attending Cardiologist  Direct Dial: [phone]  Fax: [phone]  Website:  [email or web address] 03/23/2022, 9:10 AM

## 2022-03-24 LAB — LIPID PANEL
Chol/HDL Ratio: 4.3 ratio (ref 0.0–5.0)
Cholesterol, Total: 181 mg/dL (ref 100–199)
HDL: 42 mg/dL (ref >39)
LDL Chol Calc (NIH): 103 mg/dL — ABNORMAL HIGH (ref 0–99)
Triglycerides: 211 mg/dL — ABNORMAL HIGH (ref 0–149)
VLDL Cholesterol Cal: 36 mg/dL (ref 5–40)

## 2022-03-24 LAB — LDL CHOLESTEROL, DIRECT: LDL Direct: 107 mg/dL — ABNORMAL HIGH (ref 0–99)

## 2022-03-27 DIAGNOSIS — I10 Essential (primary) hypertension: Secondary | ICD-10-CM | POA: Diagnosis not present

## 2022-03-27 DIAGNOSIS — Z0000 Encounter for general adult medical examination without abnormal findings: Secondary | ICD-10-CM | POA: Diagnosis not present

## 2022-03-27 DIAGNOSIS — D696 Thrombocytopenia, unspecified: Secondary | ICD-10-CM | POA: Diagnosis not present

## 2022-03-27 DIAGNOSIS — Z23 Encounter for immunization: Secondary | ICD-10-CM | POA: Diagnosis not present

## 2022-03-27 DIAGNOSIS — E7800 Pure hypercholesterolemia, unspecified: Secondary | ICD-10-CM | POA: Diagnosis not present

## 2022-03-30 ENCOUNTER — Other Ambulatory Visit: Payer: Self-pay | Admitting: Internal Medicine

## 2022-04-03 ENCOUNTER — Ambulatory Visit (INDEPENDENT_AMBULATORY_CARE_PROVIDER_SITE_OTHER): Payer: BC Managed Care – PPO | Admitting: Internal Medicine

## 2022-04-03 ENCOUNTER — Encounter: Payer: Self-pay | Admitting: Internal Medicine

## 2022-04-03 VITALS — BP 122/80 | HR 71 | Ht 65.0 in | Wt 191.3 lb

## 2022-04-03 DIAGNOSIS — E1165 Type 2 diabetes mellitus with hyperglycemia: Secondary | ICD-10-CM

## 2022-04-03 LAB — POCT GLYCOSYLATED HEMOGLOBIN (HGB A1C): Hemoglobin A1C: 7.4 % — AB (ref 4.0–5.6)

## 2022-04-03 NOTE — Progress Notes (Signed)
Name: Marcus Mitchell  Age/ Sex: 51 y.o., male   MRN/ DOB: [id number], 05/17/1971     PCP: Antony Contras, MD   Reason for Endocrinology Evaluation: Type 2 Diabetes Mellitus  Initial Endocrine Consultative Visit: 01/21/2019    PATIENT IDENTIFIER: Marcus Mitchell is a 51 y.o. male with a past medical history of T2DM , Hx of pancreatitis, Psoriatic arthritis  and Dyslipidemia. The patient has followed with Endocrinology clinic since 01/21/2019 for consultative assistance with management of his diabetes.  DIABETIC HISTORY:  Marcus Mitchell was diagnosed with T2DM many years ago,he has reported intolerance to Metformin. His hemoglobin A1c has ranged from 7.7% in 2019, peaking at 8.5% in 2020.   On his initial visit to our clinic he had an A1c of  8.2% ,And was on pioglitazone 30 mg daily , we started [place]  Metformin started 06/2019 but stopped in 2022 due to intolerance   Started Glipizide 12/2020 but this was stopped by December 2022 due to subjective hypoglycemia and reporting weakness with no concomitant glucose readings.   Started Glimepiride 09/2021 with an A1c 7.1%  Mother with DM  SUBJECTIVE:   During the last visit (09/28/2021): A1c 7.1 %.    Today (04/03/2022): Marcus Mitchell is here for a  follow up on diabetes management. He checks his blood sugars 1 times a day. Otherwise, the patient has not required any recent emergency interventions for hypoglycemia or hyperglycemia.    Follows with Rheumatology ([facility]) for psoriatic arthritis . Pt on Humira  He follows with neurology for idiopathic small fiber neuropathy He is being followed by cardiology now for hypertriglyceridemia      HOME DIABETES REGIMEN:  Pioglitazone 45 mg daily  Farxiga 10 mg daily  Glimepiride 1 mg daily      METER DOWNLOAD SUMMARY: 10/15-11/14/2023 Fingerstick Blood Glucose Tests = 17 Overall Mean FS Glucose = 147 Standard Deviation = 23  BG Ranges: Low = 117 High =  217    Hypoglycemic Events/30 Days: BG < 50 = 0 Episodes of symptomatic severe hypoglycemia = 0        DIABETIC COMPLICATIONS: Microvascular complications:  Denies: retinopathy, CKD neuropathy  Last eye exam: Completed 12/29/2020     Macrovascular complications:   Denies: CAD, PVD, CVA    HISTORY:  Past Medical History:  Past Medical History:  Diagnosis Date   Chronic recurrent pancreatitis ([place]) 08/05/2014   Diabetes mellitus type II, controlled ([place])    GERD (gastroesophageal reflux disease)    Hyperlipemia    Hypertension    Hypertriglyceridemia    Hypertriglyceridemia, familial 08/05/2014   Kidney stones    NAFLD (nonalcoholic fatty liver disease)    Pancreatitis, recurrent    Plaque psoriasis    Psoriatic arthritis ([place])    Past Surgical History:  Past Surgical History:  Procedure Laterality Date   CHOLECYSTECTOMY OPEN     DUODENITIS     ESOPHAGOGASTRODUODENOSCOPY     EUS N/A 07/20/2015   Procedure: UPPER ENDOSCOPIC ULTRASOUND (EUS) RADIAL;  Surgeon: Arta Silence, MD;  Location: [facility];  Service: Endoscopy;  Laterality: N/A;   Social History:  reports that he has quit smoking. His smoking use included cigarettes. He has never used smokeless tobacco. He reports that he does not drink alcohol and does not use drugs. Family History:  Family History  Problem Relation Age of Onset   Hypertension Mother    Diabetes Mother    Hyperlipidemia Father      HOME  MEDICATIONS: Allergies as of 04/03/2022       Reactions   Nsaids Hives   Metformin And Related Diarrhea   Niacin And Related Other (See Comments)   flushing of face        Medication List        Accurate as of April 03, 2022  8:53 AM. If you have any questions, ask your nurse or doctor.          cetirizine 10 MG tablet Commonly known as: ZYRTEC Take 10 mg by mouth daily.   Contour Next Monitor w/Device Kit 1 Device by Does not apply route daily.   Contour Next Test test  strip Generic drug: glucose blood USE TO CHECK BLOOD SUGAR TWICE DAILY   dapagliflozin propanediol 10 MG Tabs tablet Commonly known as: Farxiga Take 1 tablet (10 mg total) by mouth daily.   fenofibrate micronized 134 MG capsule Commonly known as: LOFIBRA Take 1 capsule (134 mg total) by mouth daily. daily.   folic acid 1 MG tablet Commonly known as: FOLVITE Take 1 mg by mouth daily.   glimepiride 1 MG tablet Commonly known as: AMARYL TAKE 1 TABLET(1 MG) BY MOUTH DAILY WITH BREAKFAST   Humira Pen 40 MG/0.8ML Pnkt Generic drug: Adalimumab Inject 40 mg into the skin every 14 (fourteen) days.   icosapent Ethyl 1 g capsule Commonly known as: Vascepa TAKE 2 CAPSULES(2 GRAMS) BY MOUTH TWICE DAILY   Lancets Micro Thin 33G Misc USE AS DIRECTED TWICE DAILY   losartan 50 MG tablet Commonly known as: COZAAR Take 50 mg by mouth daily.   meloxicam 15 MG tablet Commonly known as: MOBIC Take 15 mg by mouth daily.   methotrexate 2.5 MG tablet Commonly known as: RHEUMATREX Take 2.5 mg by mouth once a week. Take 6 tablets   omeprazole 20 MG tablet Commonly known as: PRILOSEC OTC Take 20 mg by mouth daily.   pioglitazone 45 MG tablet Commonly known as: Actos Take 1 tablet (45 mg total) by mouth daily.   pregabalin 25 MG capsule Commonly known as: LYRICA Take 25 mg by mouth 2 (two) times daily.   pregabalin 50 MG capsule Commonly known as: LYRICA Take 50 mg by mouth 3 (three) times daily.   rosuvastatin 20 MG tablet Commonly known as: CRESTOR Take 1 tablet (20 mg total) by mouth daily.   venlafaxine XR 37.5 MG 24 hr capsule Commonly known as: EFFEXOR-XR Take 37.5 mg by mouth daily.         OBJECTIVE:   Vital Signs: BP 122/80 (BP Location: Left Arm, Patient Position: Sitting, Cuff Size: Small)   Pulse 71   Ht _0  (1.651 m)   Wt 191 lb 4.8 oz (86.8 kg)   SpO2 99%   BMI 31.83 kg/m   Wt Readings from Last 3 Encounters:  04/03/22 191 lb 4.8 oz (86.8 kg)   03/23/22 189 lb 12.8 oz (86.1 kg)  09/28/21 177 lb (80.3 kg)     Exam: General: Pt appears well and is in NAD  Lungs: Clear with good BS bilat with no rales, rhonchi, or wheezes  Heart: RRR   Abdomen: Normoactive bowel sounds, soft, nontender  Extremities: No pretibial edema.  Neuro: MS is good with appropriate affect, pt is alert and Ox3    DM Foot exam 04/03/2022  The skin of the feet is intact without sores or ulcerations. The pedal pulses are 2+ on right and 2+ on left. The sensation is intact to a screening 5.07, 10  gram monofilament bilaterally    DATA REVIEWED:  Lab Results  Component Value Date   HGBA1C 7.3 (A) 09/28/2021   HGBA1C 7.1 (A) 04/27/2021   HGBA1C 7.3 (A) 12/21/2020    Latest Reference Range & Units 03/23/22 12:10  Total CHOL/HDL Ratio 0.0 - 5.0 ratio 4.3  Cholesterol, Total 100 - 199 mg/dL 181  HDL Cholesterol >39 mg/dL 42  Direct LDL 0 - 99 mg/dL 107 (H)  Triglycerides 0 - 149 mg/dL 211 (H)  VLDL Cholesterol Cal 5 - 40 mg/dL 36  LDL Chol Calc (NIH) 0 - 99 mg/dL 103 (H)     ASSESSMENT / PLAN / RECOMMENDATIONS:   1) 1) Type 2 Diabetes Mellitus, sub-optimally controlled, Without complications - Most recent A1c of 7.4 %. Goal A1c < 7.0 %.     -A1c stable but remains above goal  -Patient is intolerant metformin - He is not a candidate for GLP-1 agonists nor DPP-4 inhibitors due to risk of pancreatitis - Pt with fear of hypoglycemia, He was on glipizide but he had to stop it due to subjective hypoglycemia, I will switch him to 1 mg glimepiride, but the patient endorses symptoms of lightheadedness if he skips lunch and having to eat a snack, there has been no glucose data when he feels lightheaded -I did explain to the patient the importance of checking glucose to confirm the presence or lack of hypoglycemia, that way he is not having additional meals or snacks which ends up causing hypoglycemia and worsening A1c - I suspect he has low endogenous  insulin production due to hx of pancreatitis  -He was given a sample of Dexcom G7 to try for the next 10 days, and to give him better information about hypoglycemic episodes -If he indeed does have hypoglycemia (BG<70 mg/dL) , will consider switching to repaglinide     MEDICATIONS: -Continue Farxiga 10 mg daily  -Continue  pioglitazone 45 mg daily  -Continue Glimepiride 1 mg, daily    EDUCATION / INSTRUCTIONS: BG monitoring instructions: Patient is instructed to check his blood sugars 1 times a day, fasting Call [facility] if: BG persistently < 70  I reviewed the Rule of 15 for the treatment of hypoglycemia in detail with the patient. Literature supplied.      3) Dyslipidemia/Hypertriglyceridemia  -Per cardiology    F/U in 6 months    Signed electronically by: Mack Guise, MD  [facility]  [facility] [address] [place], [place] [address] Phone: [phone] FAX: [phone]   CC: Antony Contras, MD [place] [address] Phone: [phone]  Fax: [phone]  Return to Endocrinology clinic as below: No future appointments.

## 2022-04-03 NOTE — Patient Instructions (Signed)
-   Continue  Glimepiride 1 mg , 1 tablet before Breakfast  - Continue farxiga 10 mg daily  - Continue Pioglitazone 45 mg daily         HOW TO TREAT LOW BLOOD SUGARS (Blood sugar LESS THAN 70 MG/DL) Please follow the RULE OF 15 for the treatment of hypoglycemia treatment (when your (blood sugars are less than 70 mg/dL)   STEP 1: Take 15 grams of carbohydrates when your blood sugar is low, which includes:  3-4 GLUCOSE TABS  OR 3-4 OZ OF JUICE OR REGULAR SODA OR ONE TUBE OF GLUCOSE GEL    STEP 2: RECHECK blood sugar in 15 MINUTES STEP 3: If your blood sugar is still low at the 15 minute recheck --> then, go back to STEP 1 and treat AGAIN with another 15 grams of carbohydrates.

## 2022-04-04 DIAGNOSIS — M792 Neuralgia and neuritis, unspecified: Secondary | ICD-10-CM | POA: Diagnosis not present

## 2022-04-04 DIAGNOSIS — M50122 Cervical disc disorder at C5-C6 level with radiculopathy: Secondary | ICD-10-CM | POA: Diagnosis not present

## 2022-04-04 DIAGNOSIS — M4722 Other spondylosis with radiculopathy, cervical region: Secondary | ICD-10-CM | POA: Diagnosis not present

## 2022-04-04 DIAGNOSIS — M2578 Osteophyte, vertebrae: Secondary | ICD-10-CM | POA: Diagnosis not present

## 2022-04-04 DIAGNOSIS — R292 Abnormal reflex: Secondary | ICD-10-CM | POA: Diagnosis not present

## 2022-04-04 DIAGNOSIS — R208 Other disturbances of skin sensation: Secondary | ICD-10-CM | POA: Diagnosis not present

## 2022-04-04 DIAGNOSIS — M4802 Spinal stenosis, cervical region: Secondary | ICD-10-CM | POA: Diagnosis not present

## 2022-04-11 ENCOUNTER — Other Ambulatory Visit: Payer: Self-pay

## 2022-04-11 DIAGNOSIS — E119 Type 2 diabetes mellitus without complications: Secondary | ICD-10-CM

## 2022-04-11 MED ORDER — DAPAGLIFLOZIN PROPANEDIOL 10 MG PO TABS: 10.0000 mg | ORAL_TABLET | Freq: Every day | ORAL | 3 refills | Status: DC

## 2022-04-26 DIAGNOSIS — M7711 Lateral epicondylitis, right elbow: Secondary | ICD-10-CM | POA: Diagnosis not present

## 2022-05-01 DIAGNOSIS — M7711 Lateral epicondylitis, right elbow: Secondary | ICD-10-CM | POA: Diagnosis not present

## 2022-05-03 DIAGNOSIS — E119 Type 2 diabetes mellitus without complications: Secondary | ICD-10-CM | POA: Diagnosis not present

## 2022-05-03 DIAGNOSIS — E781 Pure hyperglyceridemia: Secondary | ICD-10-CM | POA: Diagnosis not present

## 2022-05-03 DIAGNOSIS — I10 Essential (primary) hypertension: Secondary | ICD-10-CM | POA: Diagnosis not present

## 2022-05-03 DIAGNOSIS — L4050 Arthropathic psoriasis, unspecified: Secondary | ICD-10-CM | POA: Diagnosis not present

## 2022-05-08 ENCOUNTER — Telehealth: Payer: Self-pay | Admitting: Internal Medicine

## 2022-05-08 DIAGNOSIS — L4050 Arthropathic psoriasis, unspecified: Secondary | ICD-10-CM | POA: Diagnosis not present

## 2022-05-08 DIAGNOSIS — E781 Pure hyperglyceridemia: Secondary | ICD-10-CM | POA: Diagnosis not present

## 2022-05-08 DIAGNOSIS — R202 Paresthesia of skin: Secondary | ICD-10-CM | POA: Diagnosis not present

## 2022-05-08 DIAGNOSIS — K219 Gastro-esophageal reflux disease without esophagitis: Secondary | ICD-10-CM | POA: Diagnosis not present

## 2022-05-08 DIAGNOSIS — Z8719 Personal history of other diseases of the digestive system: Secondary | ICD-10-CM | POA: Diagnosis not present

## 2022-05-08 DIAGNOSIS — I10 Essential (primary) hypertension: Secondary | ICD-10-CM | POA: Diagnosis not present

## 2022-05-08 DIAGNOSIS — G629 Polyneuropathy, unspecified: Secondary | ICD-10-CM | POA: Diagnosis not present

## 2022-05-08 DIAGNOSIS — M069 Rheumatoid arthritis, unspecified: Secondary | ICD-10-CM | POA: Diagnosis not present

## 2022-05-08 DIAGNOSIS — Z87891 Personal history of nicotine dependence: Secondary | ICD-10-CM | POA: Diagnosis not present

## 2022-05-08 DIAGNOSIS — E1140 Type 2 diabetes mellitus with diabetic neuropathy, unspecified: Secondary | ICD-10-CM | POA: Diagnosis not present

## 2022-05-08 DIAGNOSIS — Z79899 Other long term (current) drug therapy: Secondary | ICD-10-CM | POA: Diagnosis not present

## 2022-05-08 NOTE — Telephone Encounter (Signed)
PA for vascepa submitted via CMM Key: [id number] Approved today Effective from 05/08/2022 through 05/07/2023.

## 2022-05-09 DIAGNOSIS — M7711 Lateral epicondylitis, right elbow: Secondary | ICD-10-CM | POA: Diagnosis not present

## 2022-05-16 DIAGNOSIS — M7711 Lateral epicondylitis, right elbow: Secondary | ICD-10-CM | POA: Diagnosis not present

## 2022-07-16 ENCOUNTER — Telehealth: Payer: Self-pay

## 2022-07-16 MED ORDER — EMPAGLIFLOZIN 25 MG PO TABS: 25.0000 mg | ORAL_TABLET | Freq: Every day | ORAL | 3 refills | Status: AC

## 2022-07-16 NOTE — Telephone Encounter (Signed)
Faxed received by Manuela Neptune that Wilder Glade is not covered. Alternative requested.

## 2022-10-02 ENCOUNTER — Ambulatory Visit: Payer: BC Managed Care – PPO | Admitting: Internal Medicine

## 2023-05-14 ENCOUNTER — Telehealth: Payer: Self-pay | Admitting: Pharmacy Technician

## 2023-05-14 ENCOUNTER — Other Ambulatory Visit (HOSPITAL_COMMUNITY): Payer: Self-pay

## 2023-05-14 NOTE — Telephone Encounter (Signed)
Received notification in cover my meds to start prior auth for vascepa 1g. Per last encounter:  PA for vascepa submitted via CMM Key: [id number] Approved today Effective from 05/08/2022 through 05/07/2023.     However, when I just ran the claim, it came back insurance preferred brand. When I ran the brand, it said: last filled 360 on 04/26/23 and too soon 07/03/23. It appears claim will go through for brand at next fill with daw of 09 (Substitution Allowed By Prescriber but Plan Requests Brand - Patient's Plan Requested).

## 2023-06-04 ENCOUNTER — Telehealth: Payer: Self-pay | Admitting: Internal Medicine

## 2023-06-04 NOTE — Telephone Encounter (Signed)
 LVMTCB to schedule lipid f/u appt.  Called pt on 1/6, 1/8 & 06/04/23.

## 2023-06-04 NOTE — Telephone Encounter (Signed)
-----   Message from Nurse Andriette E sent at 05/24/2023 10:53 AM EST ----- Regarding: overdue for lipid appt Marcus Mitchell  Not urgent, but this patient is overdue for a lipid appt. I had sent myself a future reminder to send him a message about doing labs and realized he was due in Oct. Can you contact him? If he schedules, just lmk and I'll put his lab work orders in.   Thanks

## 2023-07-23 IMAGING — MG DIGITAL DIAGNOSTIC BILAT W/ TOMO W/ CAD
6 of 12 series · 6 of 36 positions shown · non-contrast
Comparison: None.

ACR Breast Density Category a: The breast tissue is almost entirely
fatty.

CLINICAL DATA: 50-year-old male presenting for evaluation of 3
palpable lumps, 2 with the left breast and 1 at the sternum.

EXAM:
DIGITAL DIAGNOSTIC BILATERAL MAMMOGRAM WITH TOMOSYNTHESIS AND CAD;
ULTRASOUND LEFT BREAST LIMITED
TECHNIQUE: Bilateral digital diagnostic mammography and breast tomosynthesis
was performed. The images were evaluated with computer-aided
detection.; Targeted ultrasound examination of the left breast was
performed.

[L CC synth-2D]
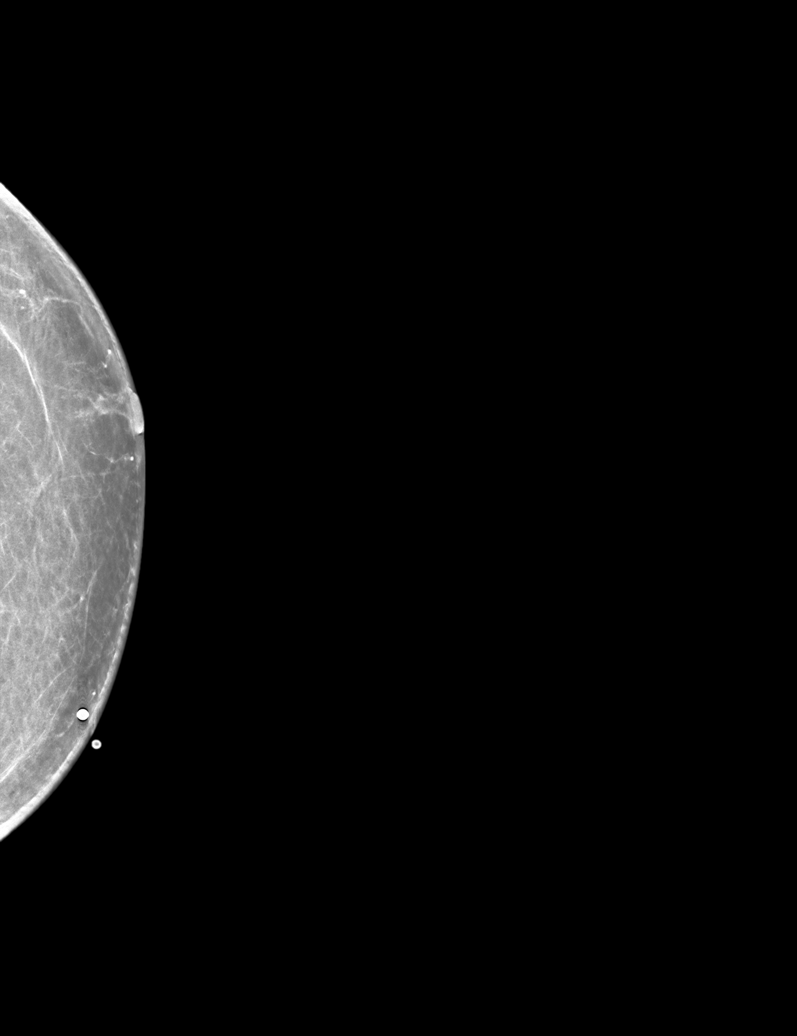

[L MLO synth-2D]
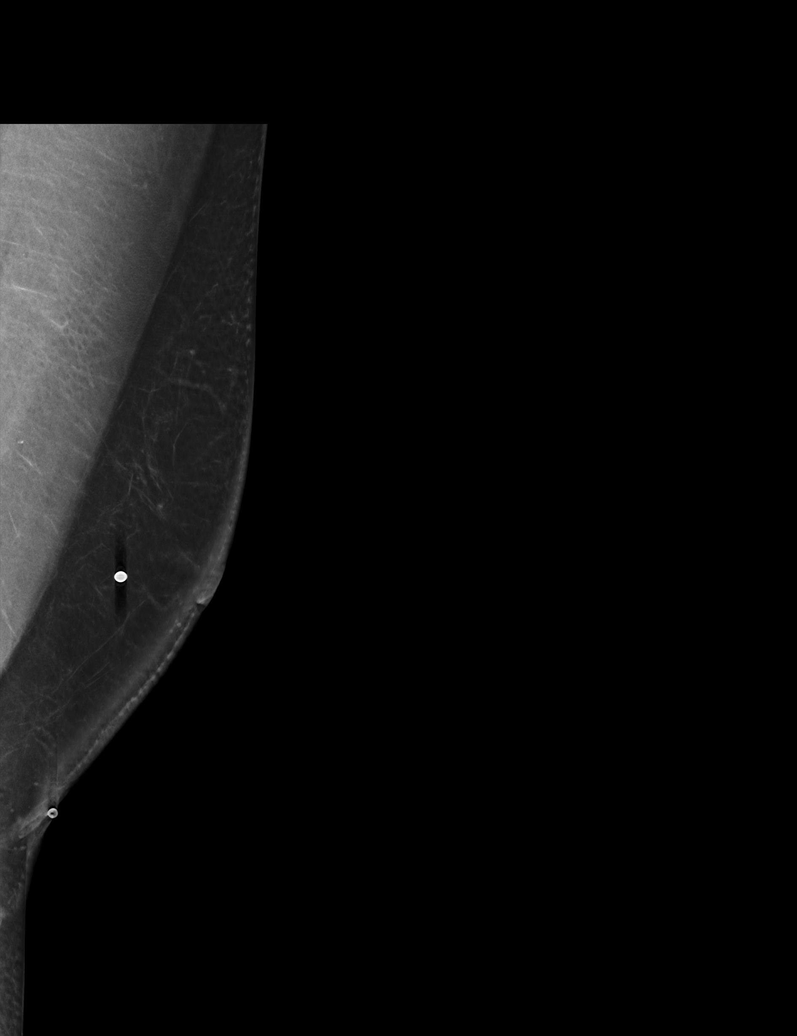

[R MLO synth-2D]
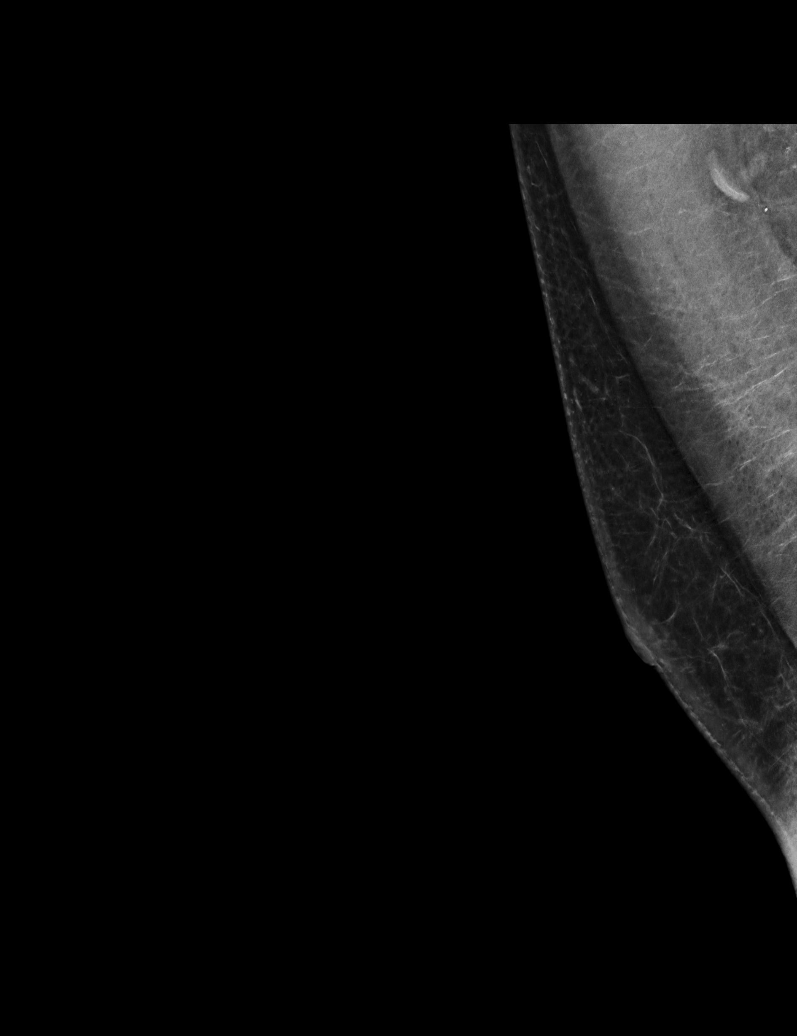

[L TAN synth-2D (1 of 2)]
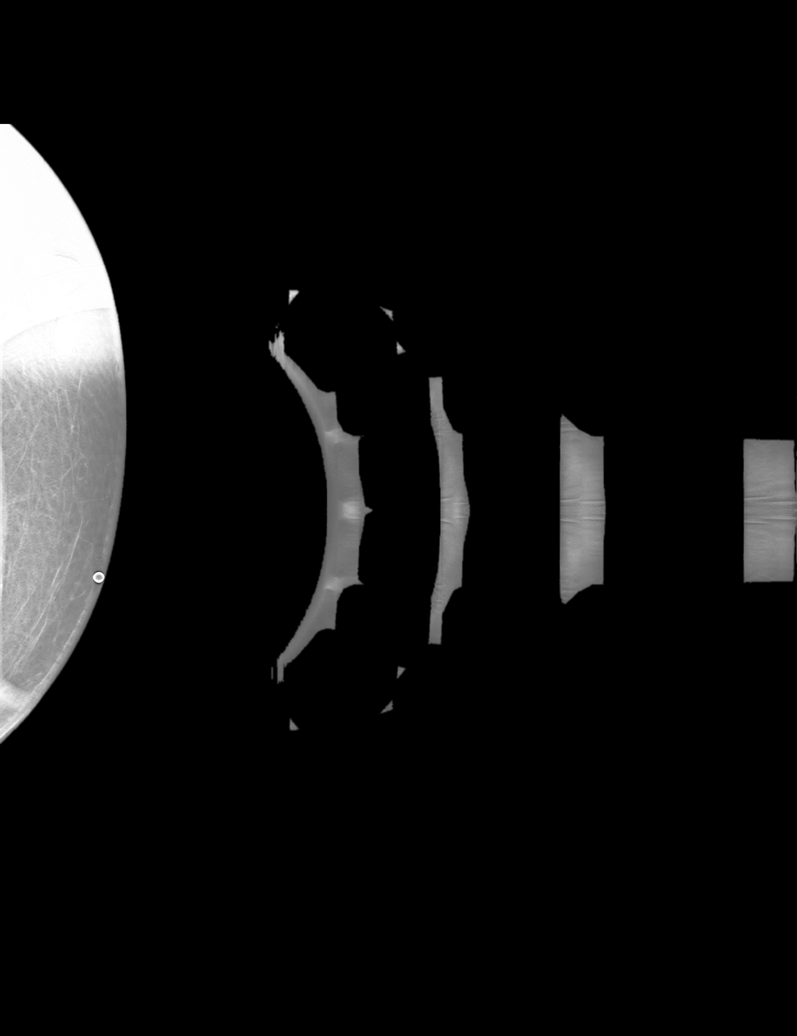

[R CC synth-2D]
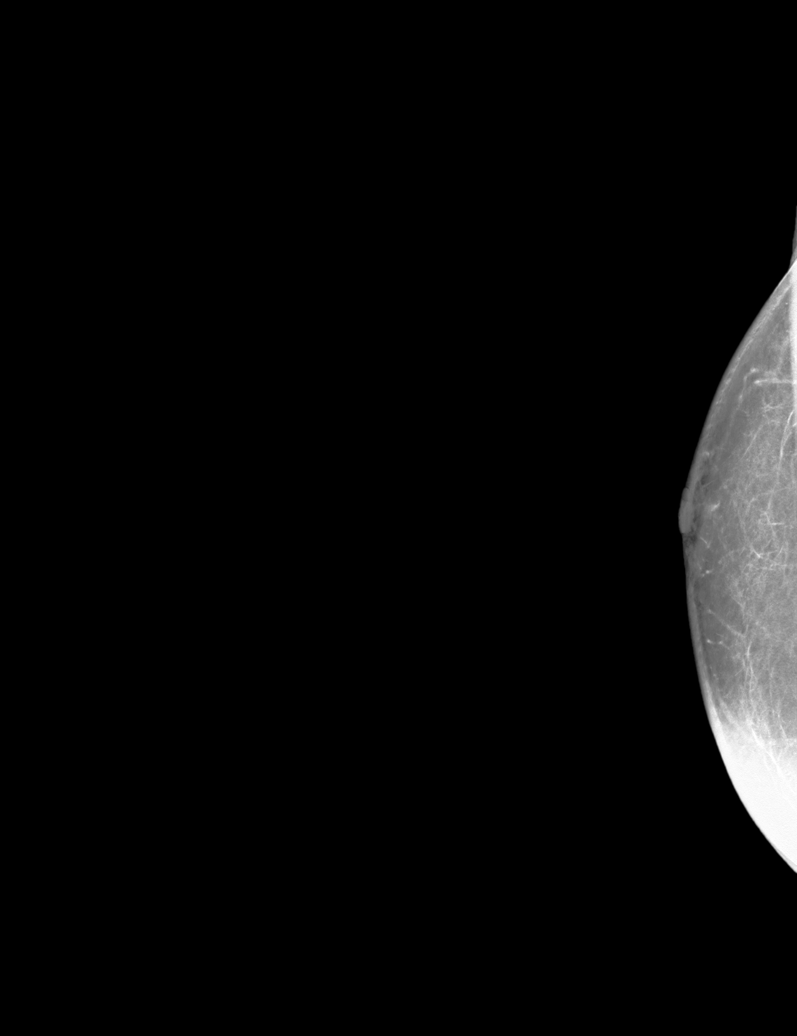

[L TAN synth-2D (2 of 2)]
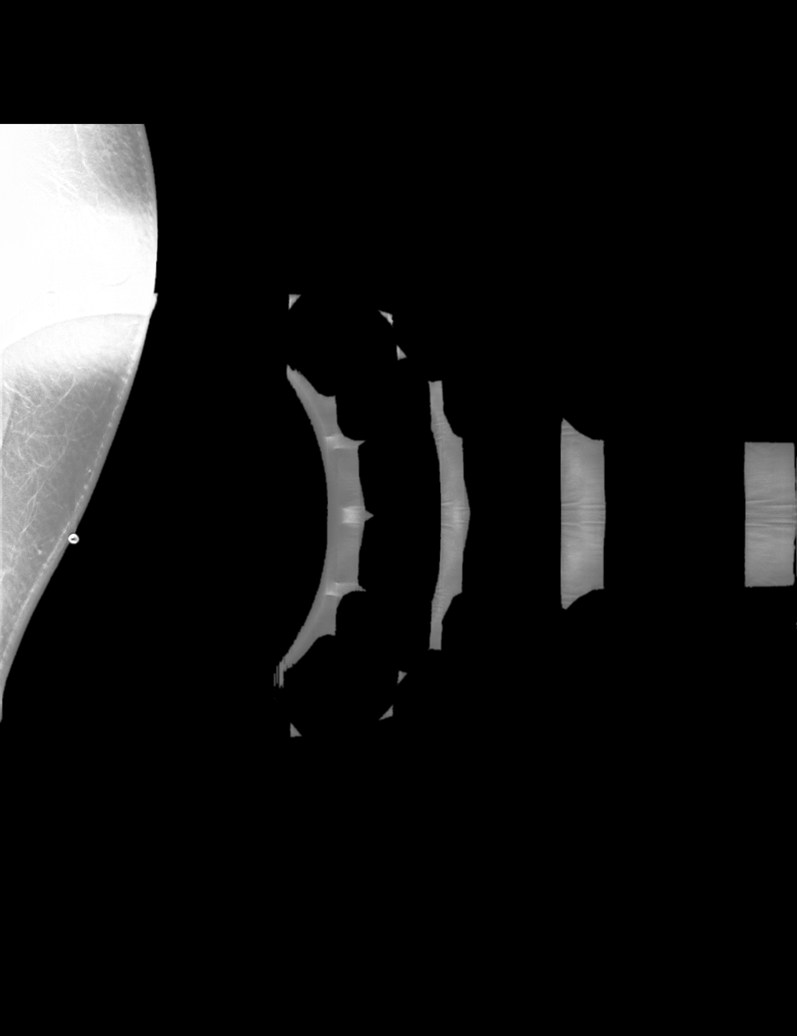

[6 of 36 positions shown; findings below may reference images not displayed]

FINDINGS: Two BBs that have been placed on the inferior left breast and the
medial left breast are seen. No suspicious mammographic
abnormalities are identified deep to these markers. No suspicious
calcifications, masses or areas of distortion are seen in the
bilateral breasts.

Ultrasound targeted to the palpable site in the left breast at [DATE],
6 cm from the nipple demonstrates a possible subtly echogenic
superficial mass measuring 1.1 cm. A more discrete echogenic oval
mass is seen at 10 o'clock, 7 cm from the nipple at the second
palpable site. This mass measures 1.2 x 0.4 x 1.0 cm. In the left
breast at 8 o'clock, 12 cm from the nipple over the sternum, there
is a third echogenic oval superficial mass measuring 1.0 x 0.5 x
cm.
IMPRESSION: 1. The 3 palpable lumps in the left breast and left sternum
correspond with benign lipomas.

2.  No mammographic evidence of malignancy in the bilateral breasts.

RECOMMENDATION:
Clinical follow-up as needed.

I have discussed the findings and recommendations with the patient.
If applicable, a reminder letter will be sent to the patient
regarding the next appointment.

BI-RADS CATEGORY  2: Benign.

## 2023-07-23 IMAGING — US US BREAST*L* LIMITED INC AXILLA
1 series · 13 of 14 positions shown · non-contrast
Comparison: None.

ACR Breast Density Category a: The breast tissue is almost entirely
fatty.

CLINICAL DATA: 50-year-old male presenting for evaluation of 3
palpable lumps, 2 with the left breast and 1 at the sternum.

EXAM:
DIGITAL DIAGNOSTIC BILATERAL MAMMOGRAM WITH TOMOSYNTHESIS AND CAD;
ULTRASOUND LEFT BREAST LIMITED
TECHNIQUE: Bilateral digital diagnostic mammography and breast tomosynthesis
was performed. The images were evaluated with computer-aided
detection.; Targeted ultrasound examination of the left breast was
performed.

[Series 1: us breast*left* limited inc axilla · 0.05mm/px · 13 of 14 slices shown]
[im 1/14]
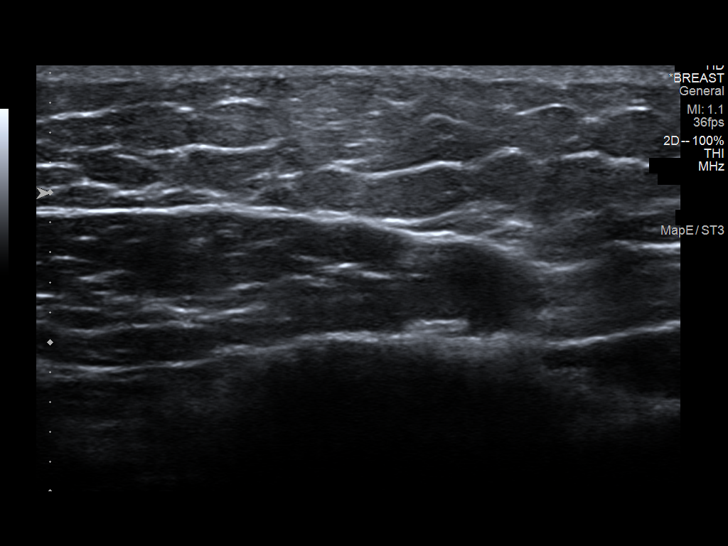
[im 2/14]
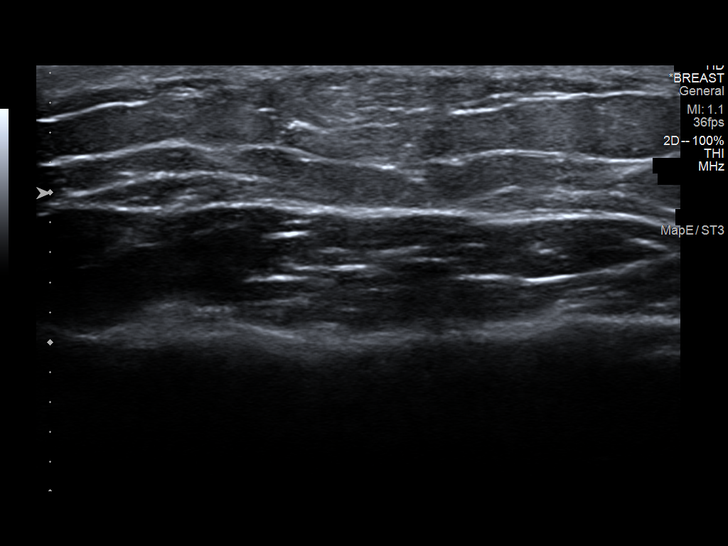
[im 3/14]
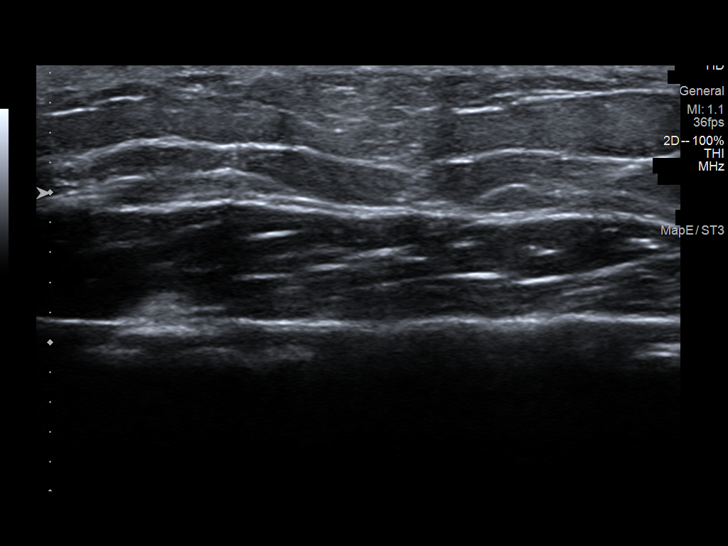
[im 4/14]
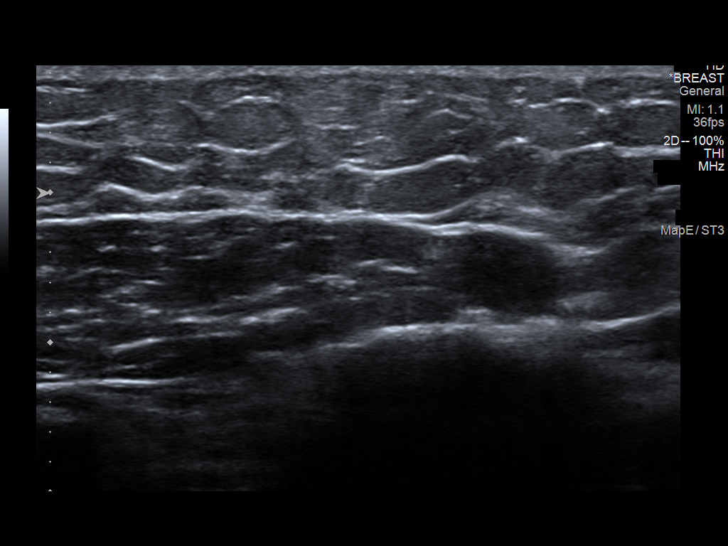
[im 5/14]
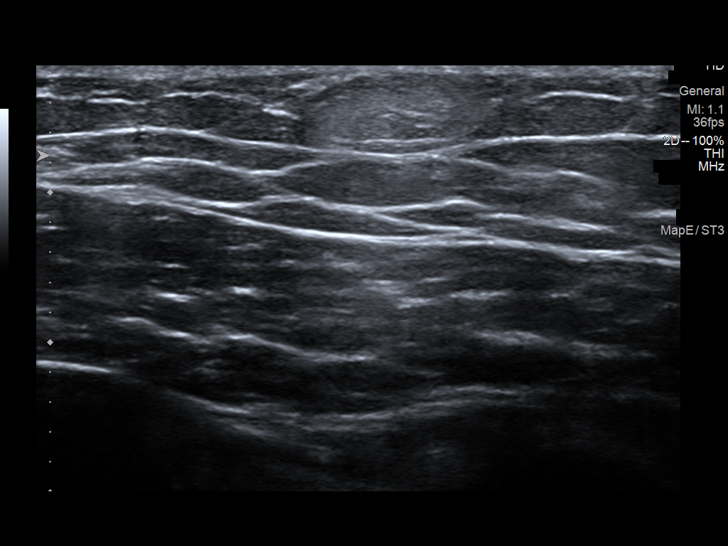
[im 6/14]
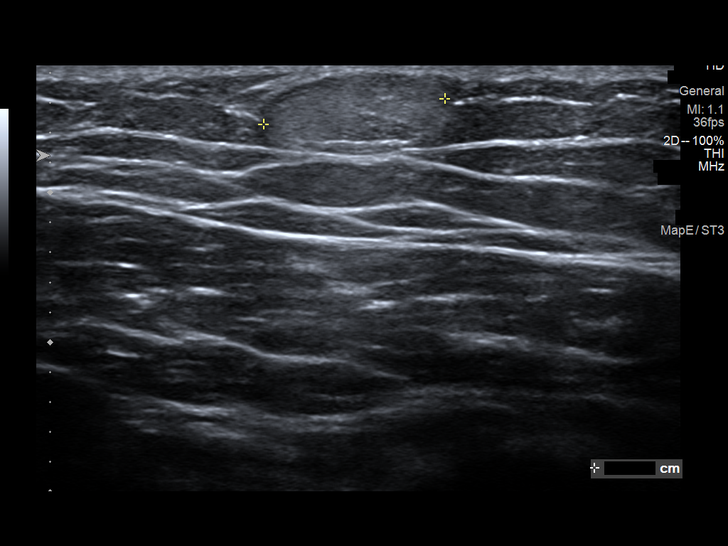
[im 8/14]
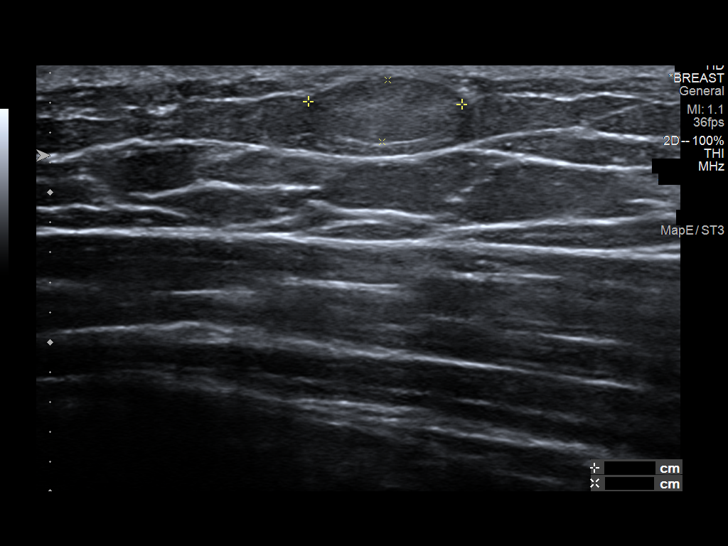
[im 9/14]
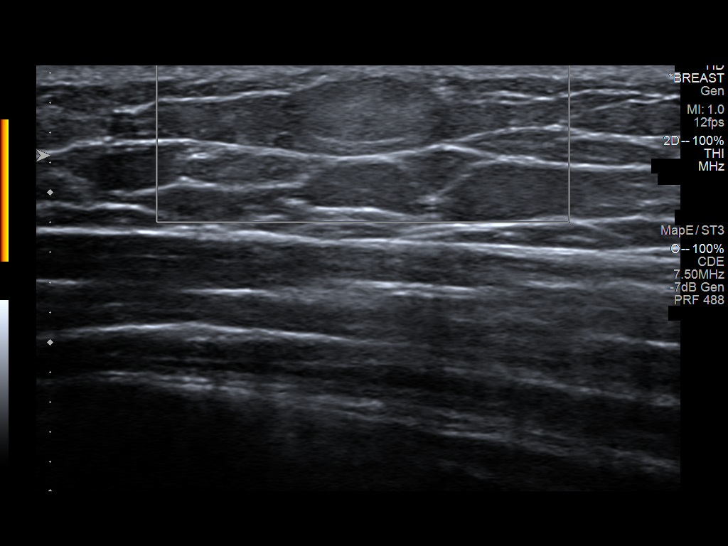
[im 10/14]
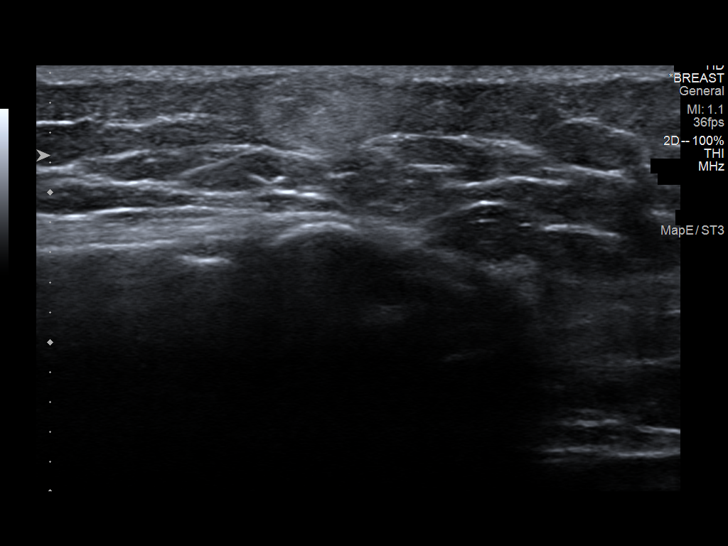
[im 11/14]
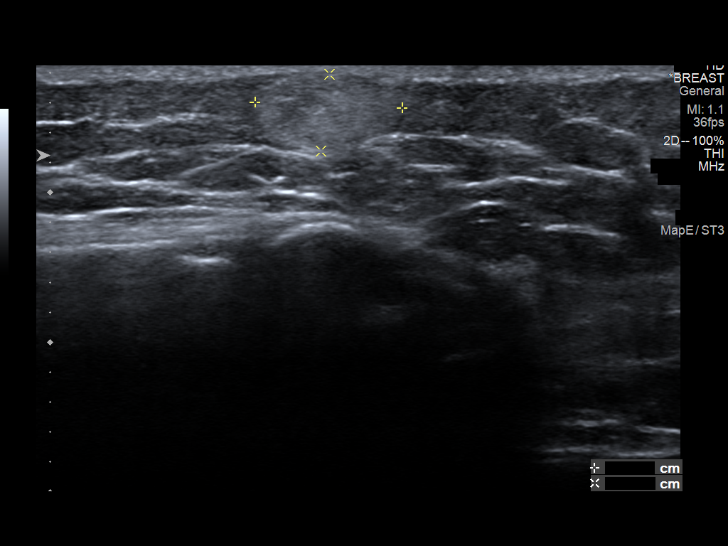
[im 12/14]
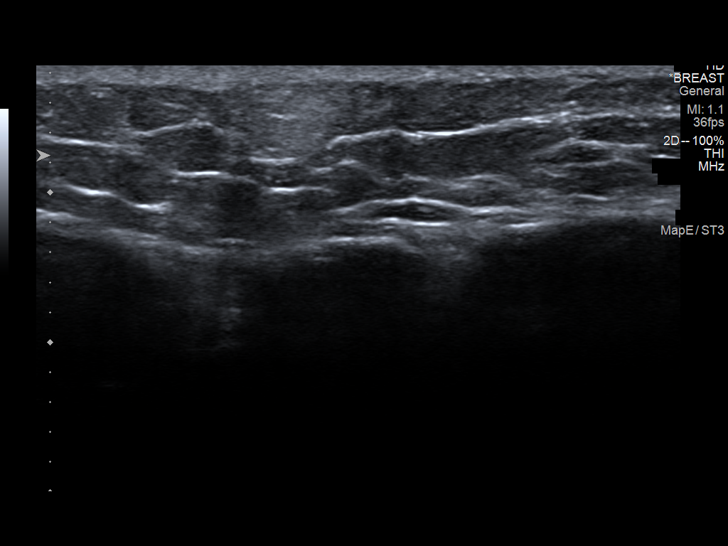
[im 13/14]
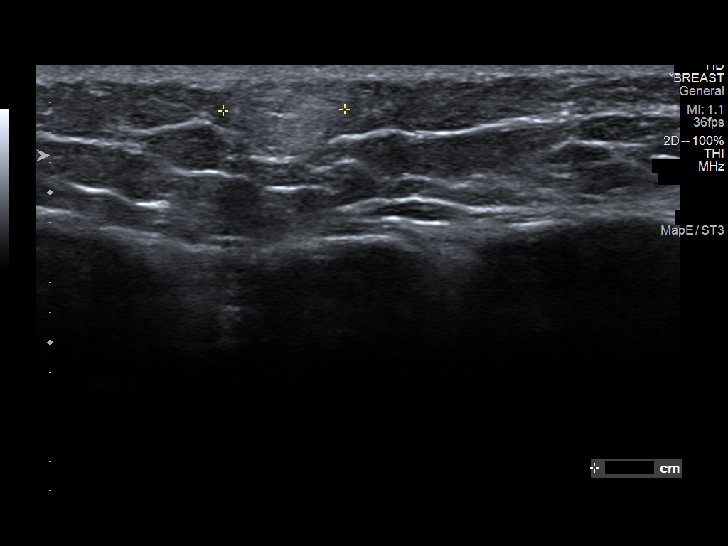
[im 14/14]
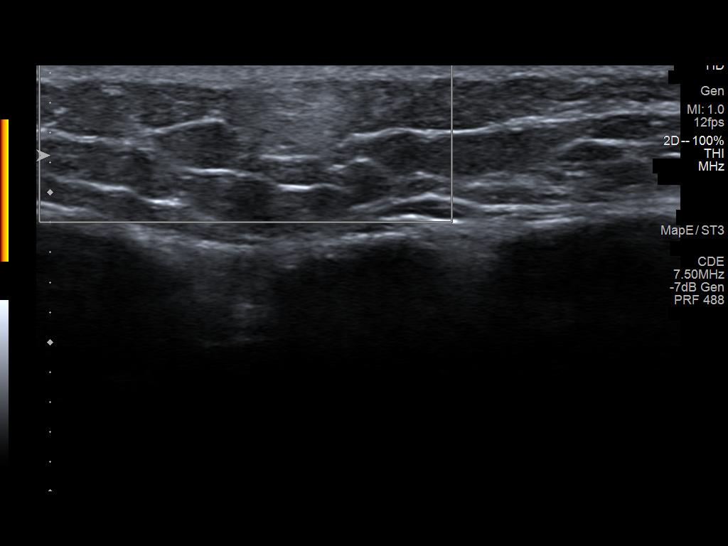

[13 of 14 positions shown; findings below may reference images not displayed]

FINDINGS: Two BBs that have been placed on the inferior left breast and the
medial left breast are seen. No suspicious mammographic
abnormalities are identified deep to these markers. No suspicious
calcifications, masses or areas of distortion are seen in the
bilateral breasts.

Ultrasound targeted to the palpable site in the left breast at [DATE],
6 cm from the nipple demonstrates a possible subtly echogenic
superficial mass measuring 1.1 cm. A more discrete echogenic oval
mass is seen at 10 o'clock, 7 cm from the nipple at the second
palpable site. This mass measures 1.2 x 0.4 x 1.0 cm. In the left
breast at 8 o'clock, 12 cm from the nipple over the sternum, there
is a third echogenic oval superficial mass measuring 1.0 x 0.5 x
cm.
IMPRESSION: 1. The 3 palpable lumps in the left breast and left sternum
correspond with benign lipomas.

2.  No mammographic evidence of malignancy in the bilateral breasts.

RECOMMENDATION:
Clinical follow-up as needed.

I have discussed the findings and recommendations with the patient.
If applicable, a reminder letter will be sent to the patient
regarding the next appointment.

BI-RADS CATEGORY  2: Benign.

## 2023-08-06 ENCOUNTER — Other Ambulatory Visit: Payer: Self-pay | Admitting: Internal Medicine

## 2023-08-16 ENCOUNTER — Other Ambulatory Visit: Payer: Self-pay | Admitting: Internal Medicine

## 2023-10-16 ENCOUNTER — Other Ambulatory Visit: Payer: Self-pay | Admitting: Internal Medicine

## 2023-11-05 ENCOUNTER — Other Ambulatory Visit: Payer: Self-pay | Admitting: Internal Medicine

## 2023-12-01 IMAGING — CT CT ABDOMEN WO/W CM
2 of 7 series · 12 of 32 positions shown, 17 images · IV contrast (APPLIED)
Comparison: Report from CT May 03, 2014. However, no
comparison imaging available at time dictation.

CLINICAL DATA: Chronic pancreatitis recent flare 1 month ago.

EXAM:
CT ABDOMEN WITHOUT AND WITH CONTRAST
TECHNIQUE: Multidetector CT imaging of the abdomen was performed following the
standard protocol before and following the bolus administration of
intravenous contrast.

[Series 3: arterial · axial · arterial · 0.72mm/px · z∈[-362,-131]mm · 6 of 109 slices shown]
[im 16/109  soft-tissue]
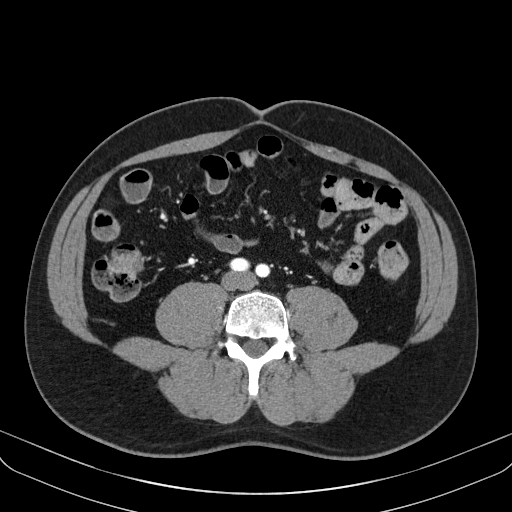
[im 31/109  soft-tissue]
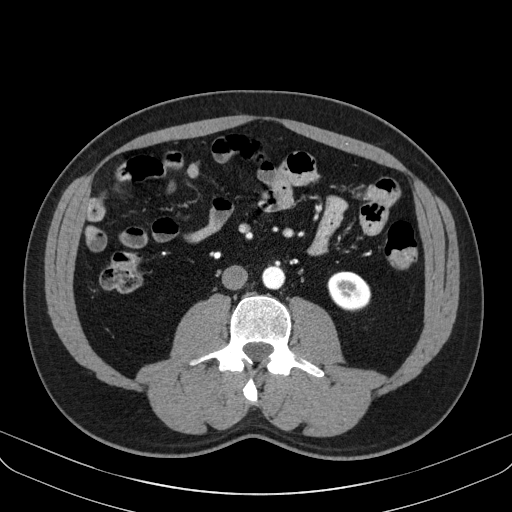
[im 47/109  soft-tissue]
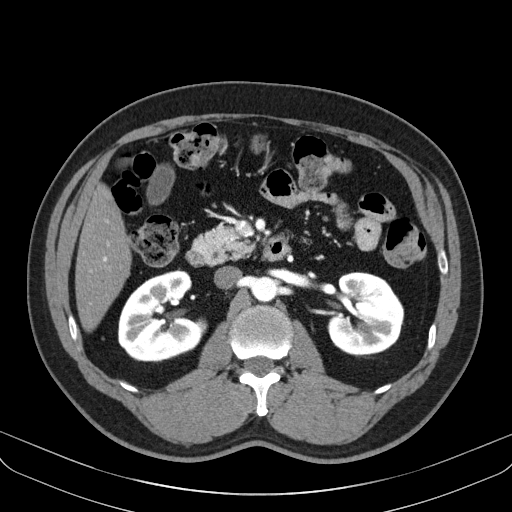
[im 62/109  soft-tissue]
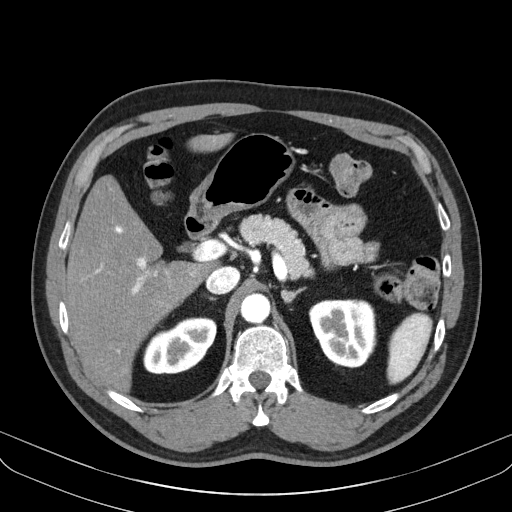
[im 78/109  soft-tissue]
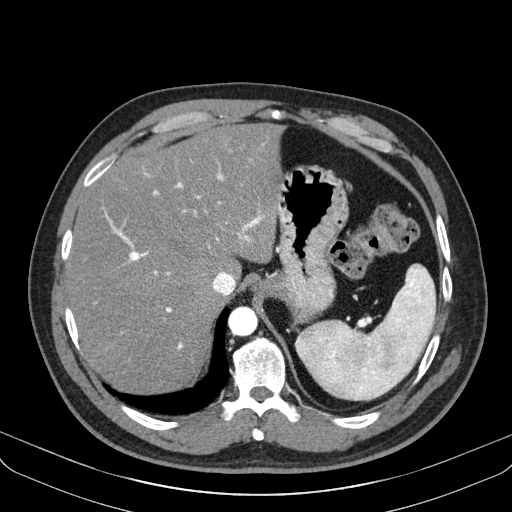
[im 93/109  soft-tissue]
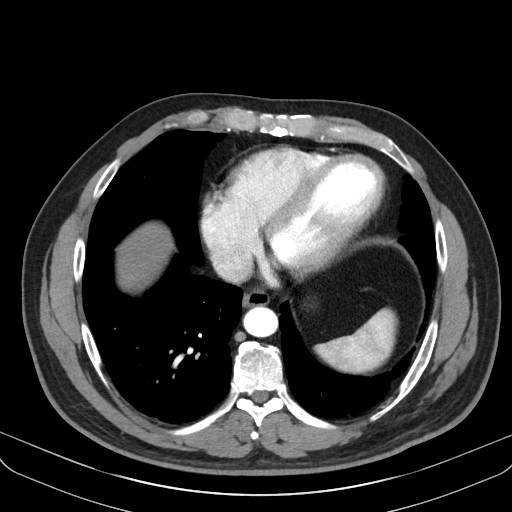

[Series 4: venous · axial · portal-venous · 0.72mm/px · z∈[-362,-131]mm · 6 of 109 slices shown, 11 images]
[im 16/109  soft-tissue]
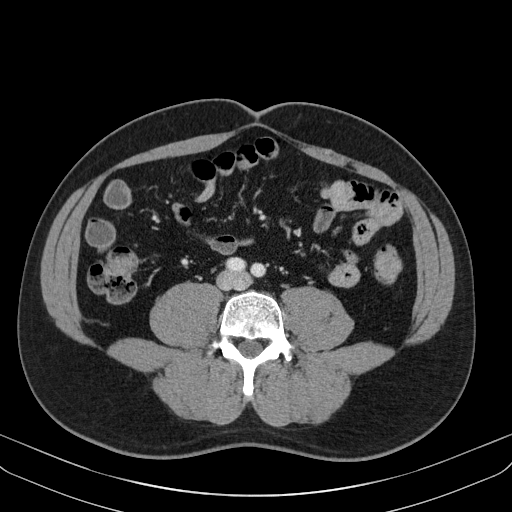
[im 16/109  bone]
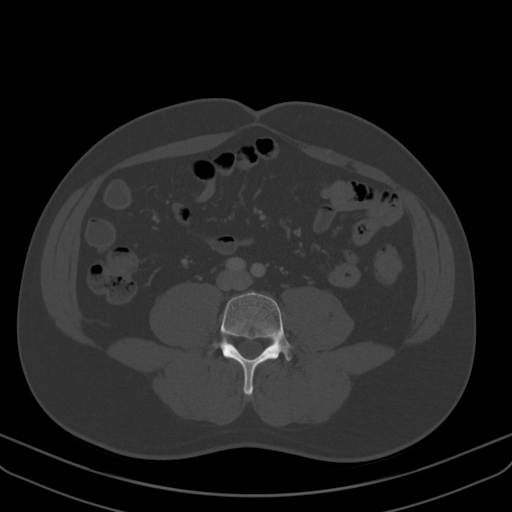
[im 31/109  soft-tissue]
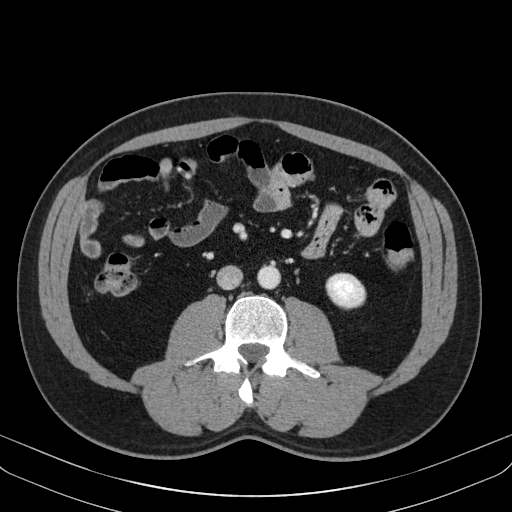
[im 47/109  soft-tissue]
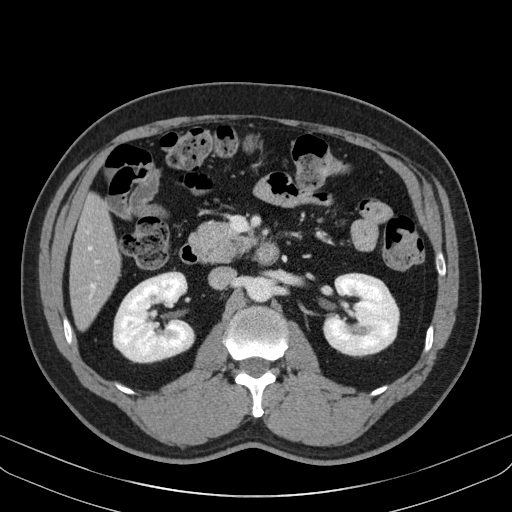
[im 47/109  lung]
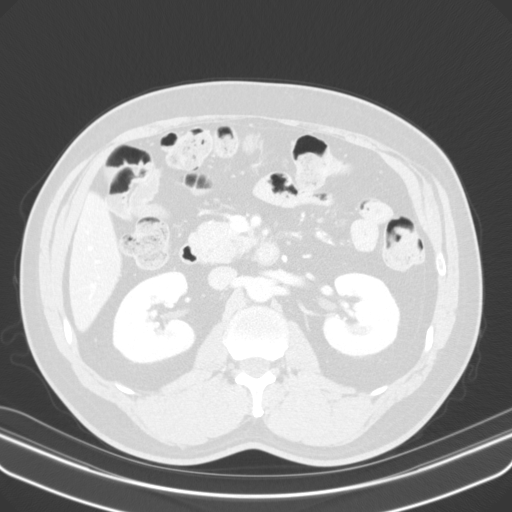
[im 62/109  soft-tissue]
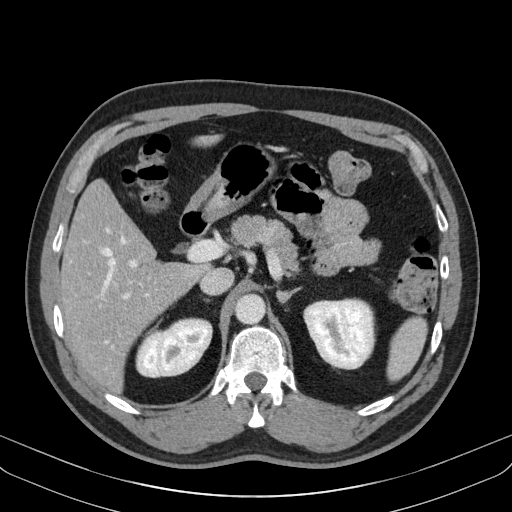
[im 62/109  lung]
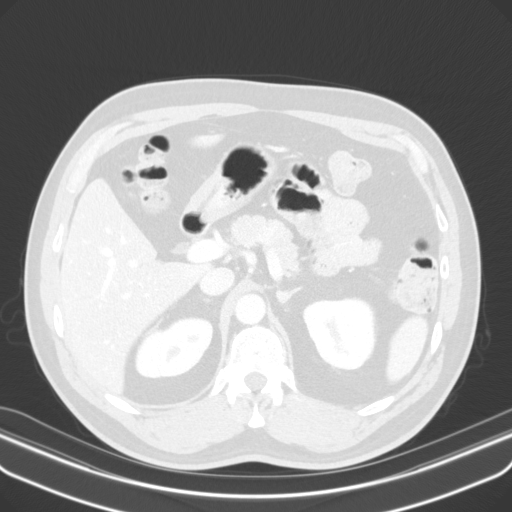
[im 78/109  soft-tissue]
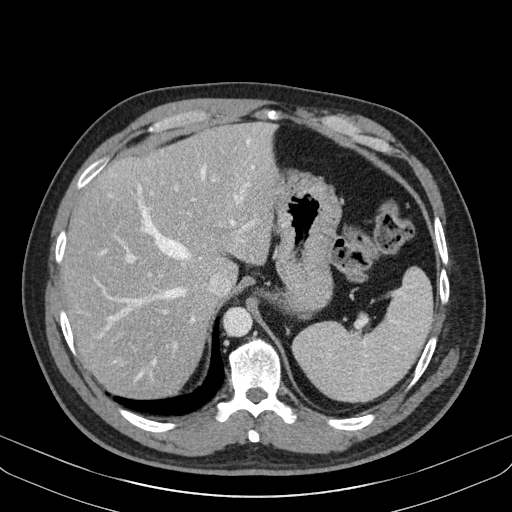
[im 78/109  lung]
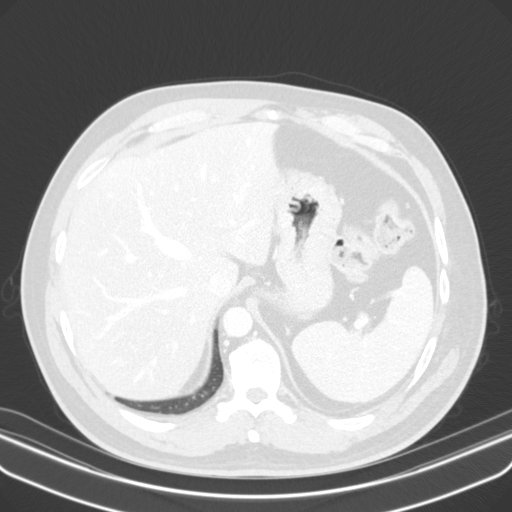
[im 93/109  soft-tissue]
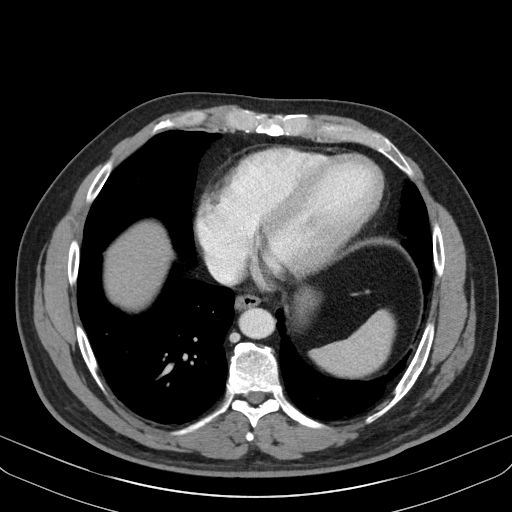
[im 93/109  lung]
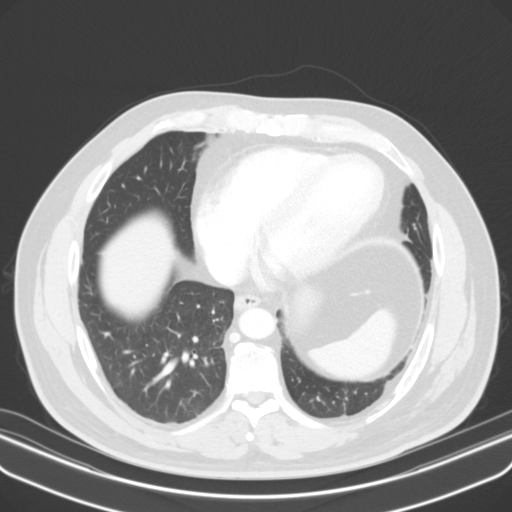

[12 of 32 positions shown; findings below may reference images not displayed]

RADIATION DOSE REDUCTION: This exam was performed according to the
departmental dose-optimization program which includes automated
exposure control, adjustment of the mA and/or kV according to
patient size and/or use of iterative reconstruction technique.

Creatinine was obtained on site at [HOSPITAL] at [HOSPITAL].

Results: Creatinine 1.0 mg/dL.

CONTRAST:  100mL [id number] IOPAMIDOL (J245LA-FTT) INJECTION 61%
FINDINGS: Lower chest: Bibasilar atelectasis with trace left pleural effusion.

Hepatobiliary: Hepatic steatosis. No suspicious hepatic lesion.
Gallbladder is surgically absent. No biliary ductal dilation.

Pancreas: No pancreatic ductal dilation or evidence of acute
inflammation.

Spleen: No splenomegaly or focal splenic lesion.

Adrenals/Urinary Tract: Bilateral adrenal glands appear normal. No
hydronephrosis. Kidneys demonstrate symmetric enhancement and
excretion of contrast material. No solid enhancing renal mass.

Stomach/Bowel: No enteric contrast was administered. Mild wall
thickening versus underdistention of the gastric antrum. Small
periampullary duodenal diverticulum. No pathologic dilation of small
or large bowel. Visualized portions of the appendix appear normal.
No evidence of acute bowel inflammation.

Vascular/Lymphatic: Normal caliber abdominal aorta. The portal,
splenic and superior mesenteric veins are patent. No abdominal
adenopathy.

Other: No significant abdominal free fluid.

Musculoskeletal: Thoracic diffuse idiopathic skeletal hyperostosis.
No acute osseous abnormality.
IMPRESSION: 1. No evidence of pancreatic ductal dilation or evidence of acute
pancreatitis. No evidence of significant sequela post reported
recent episode of acute pancreatitis.
2. Mild wall thickening versus underdistention of the gastric
antrum, correlate for gastritis.
3. Hepatic steatosis.
4. Bibasilar atelectasis with trace left pleural effusion.

## 2023-12-27 ENCOUNTER — Other Ambulatory Visit: Payer: Self-pay | Admitting: Medical Genetics

## 2024-02-13 ENCOUNTER — Other Ambulatory Visit (HOSPITAL_COMMUNITY)
Admission: RE | Admit: 2024-02-13 | Discharge: 2024-02-13 | Disposition: A | Discharge status: Home / Self Care | Payer: Self-pay | Source: Ambulatory Visit | Attending: Medical Genetics | Admitting: Medical Genetics

## 2024-02-21 LAB — GENECONNECT MOLECULAR SCREEN: Genetic Analysis Overall Interpretation: NEGATIVE

## 2024-03-24 ENCOUNTER — Other Ambulatory Visit (HOSPITAL_COMMUNITY): Payer: Self-pay

## 2024-03-24 ENCOUNTER — Other Ambulatory Visit (HOSPITAL_BASED_OUTPATIENT_CLINIC_OR_DEPARTMENT_OTHER): Payer: Self-pay

## 2024-03-25 ENCOUNTER — Other Ambulatory Visit (HOSPITAL_COMMUNITY): Payer: Self-pay

## 2024-03-25 MED ORDER — DEXCOM G7 RECEIVER DEVI
5 refills | Status: AC
  Filled 2024-03-26 – 2024-04-21 (×2): qty 1, 30d supply, fill #0
  Filled 2024-04-27: qty 1, 90d supply, fill #0
  Filled 2024-05-09: qty 1, 30d supply, fill #0

## 2024-03-25 MED ORDER — INSULIN GLARGINE 100 UNIT/ML SOLOSTAR PEN
10.0000 [IU] | PEN_INJECTOR | Freq: Every evening | SUBCUTANEOUS | 0 refills | Status: AC
  Filled 2024-03-26: qty 9, 90d supply, fill #0

## 2024-03-25 MED ORDER — FOLIC ACID 1 MG PO TABS: 1.0000 mg | ORAL_TABLET | Freq: Every day | ORAL | 3 refills | Status: AC

## 2024-03-25 MED ORDER — EMPAGLIFLOZIN 25 MG PO TABS
25.0000 mg | ORAL_TABLET | Freq: Every day | ORAL | 1 refills | Status: AC
  Filled 2024-03-26: qty 90, 90d supply, fill #0

## 2024-03-25 MED ORDER — TECHLITE PEN NEEDLES 31G X 8 MM MISC
3 refills | Status: AC
  Filled 2024-03-27: qty 100, 90d supply, fill #0

## 2024-03-25 MED ORDER — LOSARTAN POTASSIUM 50 MG PO TABS
50.0000 mg | ORAL_TABLET | Freq: Every day | ORAL | 3 refills | Status: AC
  Filled 2024-04-21: qty 90, 90d supply, fill #0

## 2024-03-25 MED ORDER — METFORMIN HCL ER 750 MG PO TB24
750.0000 mg | ORAL_TABLET | Freq: Every day | ORAL | 3 refills | Status: AC
  Filled 2024-03-26: qty 90, 90d supply, fill #0

## 2024-03-25 MED ORDER — ICOSAPENT ETHYL 1 G PO CAPS
2.0000 g | ORAL_CAPSULE | Freq: Two times a day (BID) | ORAL | 3 refills | Status: AC
  Filled 2024-04-27: qty 360, 90d supply, fill #0

## 2024-03-25 MED ORDER — METHOTREXATE SODIUM 2.5 MG PO TABS
15.0000 mg | ORAL_TABLET | ORAL | 0 refills | Status: AC
  Filled 2024-03-26: qty 72, 84d supply, fill #0

## 2024-03-25 MED ORDER — PIOGLITAZONE HCL 45 MG PO TABS
45.0000 mg | ORAL_TABLET | Freq: Every day | ORAL | 2 refills | Status: AC
  Filled 2024-03-26: qty 90, 90d supply, fill #0

## 2024-03-25 MED ORDER — FENOFIBRATE 134 MG PO CAPS
134.0000 mg | ORAL_CAPSULE | Freq: Every day | ORAL | 3 refills | Status: AC
  Filled 2024-05-09 – 2024-05-12 (×2): qty 90, 90d supply, fill #0

## 2024-03-25 MED ORDER — INSULIN PEN NEEDLE 31G X 8 MM MISC
3 refills | Status: AC
  Filled 2024-03-26: qty 100, fill #0

## 2024-03-25 MED ORDER — ROSUVASTATIN CALCIUM 40 MG PO TABS
40.0000 mg | ORAL_TABLET | Freq: Every day | ORAL | 3 refills | Status: AC
  Filled 2024-04-21: qty 90, 90d supply, fill #0

## 2024-03-25 MED ORDER — FENOFIBRATE 134 MG PO CAPS: 134.0000 mg | ORAL_CAPSULE | Freq: Every day | ORAL | 0 refills | Status: AC

## 2024-03-27 ENCOUNTER — Other Ambulatory Visit (HOSPITAL_COMMUNITY): Payer: Self-pay

## 2024-03-27 ENCOUNTER — Other Ambulatory Visit: Payer: Self-pay

## 2024-03-28 ENCOUNTER — Other Ambulatory Visit (HOSPITAL_COMMUNITY): Payer: Self-pay

## 2024-03-30 ENCOUNTER — Other Ambulatory Visit: Payer: Self-pay

## 2024-03-31 ENCOUNTER — Other Ambulatory Visit: Payer: Self-pay

## 2024-04-21 ENCOUNTER — Other Ambulatory Visit (HOSPITAL_COMMUNITY): Payer: Self-pay

## 2024-04-21 ENCOUNTER — Other Ambulatory Visit: Payer: Self-pay

## 2024-04-21 ENCOUNTER — Other Ambulatory Visit (HOSPITAL_BASED_OUTPATIENT_CLINIC_OR_DEPARTMENT_OTHER): Payer: Self-pay

## 2024-04-21 NOTE — Telephone Encounter (Signed)
 Copied from CRM #[id number]. 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 Follow package directions for replacement., Disp: 5 each, Rfl: 11   cetirizine (ZyrTEC) 10 mg tablet, Take 10 mg by mouth Once Daily., Disp: , Rfl:    cyanocobalamin , vitamin B-12, (VITAMIN B-12 ORAL), Take by mouth., Disp: , Rfl:    empagliflozin  (Jardiance ) 25 mg tab, TAKE 1 TABLET(25 MG) BY MOUTH DAILY, Disp: 90 tablet, Rfl: 1   escitalopram (LEXAPRO) 10 mg tablet, Take 1 tablet (10 mg total) by mouth daily., Disp: 30 tablet, Rfl: 0   fenofibrate  micronized (LOFIBRA) 134 mg capsule, Take 1 capsule (134 mg total) by mouth daily., Disp: 90 capsule, Rfl: 3   folic acid  (FOLVITE ) 1 mg tablet, TAKE 1 TABLET(1 MG) BY MOUTH DAILY, Disp: 90 tablet, Rfl: 3   glucose blood (Contour Next Test Strips) test strip, 1 each by Other route 2 (two) times a day., Disp: , Rfl:    insulin  glargine (LANTUS  SoloStar) 100 unit/mL (3 mL) pen, Inject 10 Units under the skin nightly. (Patient not taking: Reported on 04/01/2024),  Disp: , Rfl:    insulin  glargine-yfgn (Semglee ,insulin  glarg-yfgn,Pen) 100 unit/mL (3 mL) pen, Inject 10 Units under the skin nightly., Disp: 9 mL, Rfl: 0   losartan  (COZAAR ) 50 mg tablet, Take 1 tablet (50 mg total) by mouth daily., Disp: 90 tablet, Rfl: 3   metFORMIN  (GLUCOPHAGE  XR) 750 mg 24 hr tablet, Take 1 tablet (750 mg total) by mouth daily with breakfast., Disp: 90 tablet, Rfl: 3   methotrexate  2.5 mg tablet, TAKE 6 TABLETS(15 MG) BY MOUTH 1 TIME A WEEK, Disp: 30 tablet, Rfl: 0   Microlet Lancet misc, USE AS DIRECTED TWICE DAILY, Disp: , Rfl:    omeprazole magnesium (PriLOSEC OTC) 20 mg EC tablet, Take 20 mg by mouth daily., Disp: , Rfl:    pen needle, diabetic (BD Ultra-Fine Short Pen Needle) 31 gauge x 5/16 ndle, Inject 1 Needle under the skin at bedtime., Disp: 100 each, Rfl: 3   pioglitazone  (ACTOS ) 45 mg tablet, Take 1 tablet (45 mg total) by mouth daily., Disp: 90 tablet, Rfl: 3   pregabalin (LYRICA) 50 mg capsule, Take 1 capsule (50 mg total) by mouth in the morning and 1 capsule (50 mg total) at noon and 1 capsule (50 mg total) in the evening. Take with meals., Disp: 90 capsule, Rfl: 5   promethazine-dextromethorphan (PHENERGAN DM) 6.25-15 mg/5 mL syrp syrup, Take 5 mL by mouth nightly as needed (cough,  nausea). Do not work or drive while taking. Do not  take at the same time as other cough suppressants., Disp: 120 mL, Rfl: 0   rosuvastatin  (CRESTOR ) 40 mg tablet, Take 1 tablet (40 mg total) by mouth daily., Disp: 90 tablet, Rfl: 3   Vascepa  1 gram cap, TAKE 2 CAPSULES BY MOUTH TWICE DAILY, Disp: 360 capsule, Rfl: 3     Medication Request/Refills: Pharmacy Information (if applicable)   [] Not Applicable       []  Pharmacy listed  Send Medication Request un:[email or web address] - [facility] - [place], [place] - [place]. [facility] [address] - PHONE: [phone] - FAX: [phone]                                                 [] Pharmacy not listed  (added to pharmacy list in Epic) Send Medication Request to:      Listed Pharmacies: [facility] - [place] - [place], [place] - [address] - PHONE: [phone] - FAX: [phone] DARRYLE LONG - [facility] - [place], [place] - [place]. [facility] AT [address] - PHONE: [phone] - FAX: [phone] [facility] [place] - [place], [place] - [address] - PHONE: [phone] - FAX: [phone]

## 2024-04-27 ENCOUNTER — Other Ambulatory Visit (HOSPITAL_COMMUNITY): Payer: Self-pay

## 2024-04-27 ENCOUNTER — Other Ambulatory Visit: Payer: Self-pay

## 2024-05-10 ENCOUNTER — Other Ambulatory Visit (HOSPITAL_COMMUNITY): Payer: Self-pay

## 2024-05-11 ENCOUNTER — Other Ambulatory Visit (HOSPITAL_COMMUNITY): Payer: Self-pay

## 2024-05-12 ENCOUNTER — Other Ambulatory Visit (HOSPITAL_COMMUNITY): Payer: Self-pay

## 2024-05-12 ENCOUNTER — Other Ambulatory Visit: Payer: Self-pay

## 2024-05-19 ENCOUNTER — Encounter (HOSPITAL_BASED_OUTPATIENT_CLINIC_OR_DEPARTMENT_OTHER): Payer: Self-pay | Admitting: Emergency Medicine

## 2024-05-19 ENCOUNTER — Emergency Department (HOSPITAL_BASED_OUTPATIENT_CLINIC_OR_DEPARTMENT_OTHER): Admission: EM | Admit: 2024-05-19 | Discharge: 2024-05-19 | Disposition: A | Discharge status: Home / Self Care

## 2024-05-19 ENCOUNTER — Other Ambulatory Visit: Payer: Self-pay

## 2024-05-19 DIAGNOSIS — Z794 Long term (current) use of insulin: Secondary | ICD-10-CM | POA: Insufficient documentation

## 2024-05-19 DIAGNOSIS — I10 Essential (primary) hypertension: Secondary | ICD-10-CM | POA: Diagnosis not present

## 2024-05-19 DIAGNOSIS — X500XXA Overexertion from strenuous movement or load, initial encounter: Secondary | ICD-10-CM | POA: Insufficient documentation

## 2024-05-19 DIAGNOSIS — Z79899 Other long term (current) drug therapy: Secondary | ICD-10-CM | POA: Diagnosis not present

## 2024-05-19 DIAGNOSIS — S3992XA Unspecified injury of lower back, initial encounter: Secondary | ICD-10-CM | POA: Diagnosis present

## 2024-05-19 DIAGNOSIS — S39012A Strain of muscle, fascia and tendon of lower back, initial encounter: Secondary | ICD-10-CM | POA: Diagnosis not present

## 2024-05-19 DIAGNOSIS — E119 Type 2 diabetes mellitus without complications: Secondary | ICD-10-CM | POA: Insufficient documentation

## 2024-05-19 MED ORDER — LIDOCAINE 5 % EX PTCH
1.0000 | MEDICATED_PATCH | CUTANEOUS | Status: DC
  Administered 2024-05-19: 1 via TRANSDERMAL
  Filled 2024-05-19: qty 1

## 2024-05-19 MED ORDER — METHOCARBAMOL 500 MG PO TABS
1000.0000 mg | ORAL_TABLET | Freq: Once | ORAL | Status: AC
  Administered 2024-05-19: 1000 mg via ORAL
  Filled 2024-05-19: qty 2

## 2024-05-19 MED ORDER — KETOROLAC TROMETHAMINE 15 MG/ML IJ SOLN: 15.0000 mg | Freq: Once | INTRAMUSCULAR | Status: DC

## 2024-05-19 MED ORDER — METHOCARBAMOL 500 MG PO TABS: 500.0000 mg | ORAL_TABLET | Freq: Two times a day (BID) | ORAL | 0 refills | Status: AC

## 2024-05-19 MED ORDER — KETOROLAC TROMETHAMINE 15 MG/ML IJ SOLN
15.0000 mg | Freq: Once | INTRAMUSCULAR | Status: AC
  Administered 2024-05-19: 15 mg via INTRAMUSCULAR
  Filled 2024-05-19: qty 1

## 2024-05-19 NOTE — ED Provider Notes (Signed)
 " [place] [facility] Provider Note   CSN: [id number] Arrival date & time: 05/19/24  1233     Patient presents with: Back Pain   Marcus Mitchell is a 53 y.o. male.   Patient with history of diabetes, GERD, hypertension, hyperlipidemia, psoriatic arthritis presents today with complaints of back pain. Reports that he was at work this morning and was lifting heavy crates of water.  Around 10:00 this morning he felt a pulling sensation in his low back followed by some discomfort, but the pain was manageable.  He continued to work, unloaded the shipment of water and went to his desk and continued working.  A couple hours later he got up to go to the bathroom and sat down on the toilet.  When he went to stand up from the toilet he had more severe pain that radiated down both his legs, however was worse on the right side. Denies history of similar pain previously, does report some remote history of back pain before but states this is different. States that he is able to walk but it is painful. Reports sharp shooting pain down his right leg to his ankle with movement. No numbness/tingling.  No loss of bowel or bladder function or saddle anesthesia. Pain is minimal with rest.  Denies fevers, chills, nausea, vomiting, diarrhea, or abdominal pain. He has not tried anything for pain, took a naproxen for chronic unrelated pain at 9 am this morning prior to the incident.  The history is provided by the patient. No language interpreter was used.  Back Pain      Prior to Admission medications  Medication Sig Start Date End Date Taking? Authorizing Provider  Blood Glucose Monitoring Suppl (CONTOUR NEXT MONITOR) w/Device KIT 1 Device by Does not apply route daily. 01/27/19   Shamleffer, Ibtehal Jaralla, MD  cetirizine (ZYRTEC) 10 MG tablet Take 10 mg by mouth daily.    [provider]  Continuous Glucose Receiver (DEXCOM G7 RECEIVER) DEVI Use as directed with  compatible sensors to monitor blood sugar levels. 01/21/24     CONTOUR NEXT TEST test strip USE TO CHECK BLOOD SUGAR TWICE DAILY 12/21/20   Shamleffer, Donell Cardinal, MD  empagliflozin  (JARDIANCE ) 25 MG TABS tablet Take 1 tablet (25 mg total) by mouth daily before breakfast. 07/16/22   Shamleffer, Donell Cardinal, MD  empagliflozin  (JARDIANCE ) 25 MG TABS tablet Take 1 tablet (25 mg total) by mouth daily. 12/18/23     fenofibrate  micronized (LOFIBRA) 134 MG capsule TAKE 1 CAPSULE(134 MG) BY MOUTH DAILY 11/07/23   Hilty, Vinie BROCKS, MD  fenofibrate  micronized (LOFIBRA) 134 MG capsule Take 1 capsule (134 mg total) by mouth daily. 10/18/23     fenofibrate  micronized (LOFIBRA) 134 MG capsule Take 1 capsule (134 mg total) by mouth daily. 11/07/23   Hilty, Vinie BROCKS, MD  folic acid  (FOLVITE ) 1 MG tablet Take 1 mg by mouth daily. 02/05/22   [provider]  folic acid  (FOLVITE ) 1 MG tablet Take 1 tablet (1 mg total) by mouth daily. 02/17/24     glimepiride  (AMARYL ) 1 MG tablet TAKE 1 TABLET(1 MG) BY MOUTH DAILY WITH BREAKFAST 03/30/22   Shamleffer, Ibtehal Jaralla, MD  HUMIRA PEN 40 MG/0.8ML PNKT Inject 40 mg into the skin every 14 (fourteen) days.  07/29/14   [provider]  icosapent  Ethyl (VASCEPA ) 1 g capsule TAKE 2 CAPSULES(2 GRAMS) BY MOUTH TWICE DAILY 08/11/21   Hilty, Vinie BROCKS, MD  icosapent  Ethyl (VASCEPA ) 1  g capsule Take 2 capsules (2 g total) by mouth 2 (two) times daily. 01/27/24     insulin  glargine (LANTUS ) 100 UNIT/ML Solostar Pen Inject 10 Units into the skin at bedtime. 01/23/24     Insulin  Pen Needle (TECHLITE PEN NEEDLES) 31G X 8 MM MISC Use 1 pen needle to inject under the skin at bedtime. 02/04/24     Insulin  Pen Needle 31G X 8 MM MISC Need directions 01/23/24     Lancets Micro Thin 33G MISC USE AS DIRECTED TWICE DAILY 12/21/20   Shamleffer, Ibtehal Jaralla, MD  losartan  (COZAAR ) 50 MG tablet Take 50 mg by mouth daily. 03/19/19   [provider]  losartan  (COZAAR ) 50 MG tablet  Take 1 tablet (50 mg total) by mouth daily. 10/18/23     meloxicam (MOBIC) 15 MG tablet Take 15 mg by mouth daily. 03/20/22   [provider]  metFORMIN  (GLUCOPHAGE -XR) 750 MG 24 hr tablet Take 1 tablet (750 mg total) by mouth daily with breakfast. 03/02/24     methotrexate  (RHEUMATREX) 2.5 MG tablet Take 2.5 mg by mouth once a week. Take 6 tablets 02/05/22   [provider]  methotrexate  (RHEUMATREX) 2.5 MG tablet Take 6 tablets (15 mg total) by mouth once a week as directed by prescriber. 10/24/23     omeprazole (PRILOSEC OTC) 20 MG tablet Take 20 mg by mouth daily.     [provider]  pioglitazone  (ACTOS ) 45 MG tablet Take 1 tablet (45 mg total) by mouth daily. 04/27/21   Shamleffer, Ibtehal Jaralla, MD  pioglitazone  (ACTOS ) 45 MG tablet Take 1 tablet (45 mg total) by mouth daily. 10/18/23     pregabalin (LYRICA) 25 MG capsule Take 25 mg by mouth 2 (two) times daily.    [provider]  pregabalin (LYRICA) 50 MG capsule Take 50 mg by mouth 3 (three) times daily. 06/14/21   [provider]  rosuvastatin  (CRESTOR ) 20 MG tablet Take 1 tablet (20 mg total) by mouth daily. 08/11/21   Hilty, Vinie BROCKS, MD  rosuvastatin  (CRESTOR ) 40 MG tablet Take 1 tablet (40 mg total) by mouth daily. 09/26/23     venlafaxine XR (EFFEXOR-XR) 37.5 MG 24 hr capsule Take 37.5 mg by mouth daily. 04/07/21   [provider]    Allergies: Metformin  and related and Niacin and related    Review of Systems  Musculoskeletal:  Positive for back pain.  All other systems reviewed and are negative.   Updated Vital Signs BP (!) 146/97 (BP Location: Left Arm)   Pulse 94   Temp 98 F (36.7 C) (Oral)   Resp 16   Ht 5' 5 (1.651 m)   SpO2 100%   BMI 31.83 kg/m   Physical Exam Vitals and nursing note reviewed.  Constitutional:      General: He is not in acute distress.    Appearance: Normal appearance. He is normal weight. He is not ill-appearing, toxic-appearing or  diaphoretic.  HENT:     Head: Normocephalic and atraumatic.  Cardiovascular:     Rate and Rhythm: Normal rate.  Pulmonary:     Effort: Pulmonary effort is normal. No respiratory distress.  Abdominal:     General: Abdomen is flat.     Palpations: Abdomen is soft.     Tenderness: There is no abdominal tenderness.  Musculoskeletal:        General: Normal range of motion.     Cervical back: Normal range of motion.     Comments: No  midline tenderness to palpation of the cervical, thoracic, or lumbar spine.  No step-offs, lesions, deformity, or overlying skin changes.  No reproducible tenderness to palpation throughout the low back area.  Patient is able to lift both of his legs up off the bed and hold them, pain is worse on the right side but also present on the left.  Is able to flex and extend bilateral knees with pain. Full intact dorsiflexion and plantarflexion of bilateral lower legs without pain.  Negative straight leg raise bilaterally. DP and PT pulses intact and 2+ bilaterally.  Skin:    General: Skin is warm and dry.  Neurological:     General: No focal deficit present.     Mental Status: He is alert.  Psychiatric:        Mood and Affect: Mood normal.        Behavior: Behavior normal.     (all labs ordered are listed, but only abnormal results are displayed) Labs Reviewed - No data to display  EKG: None  Radiology: No results found.   Procedures   Medications Ordered in the ED  ketorolac  (TORADOL ) 15 MG/ML injection 15 mg (has no administration in time range)  methocarbamol  (ROBAXIN ) tablet 1,000 mg (has no administration in time range)  lidocaine  (LIDODERM ) 5 % 1 patch (has no administration in time range)                                    Medical Decision Making Risk Prescription drug management.   Patient presents today with complaints of with back pain since 10 am this mornig.  They are afebrile, nontoxic-appearing, and in no acute distress reassuring  vital signs.  Physical exam reveals   No midline tenderness to palpation of the cervical, thoracic, or lumbar spine.  No step-offs, lesions, deformity, or overlying skin changes.  No reproducible tenderness to palpation throughout the low back area.  Patient is able to lift both of his legs up off the bed and hold them, pain is worse on the right side but also present on the left.  Is able to flex and extend bilateral knees with pain. Full intact dorsiflexion and plantarflexion of bilateral lower legs without pain.  Negative straight leg raise bilaterally. DP and PT pulses intact and 2+ bilaterally. My emergent differential diagnosis includes slipped disc, compression fracture, spondylolisthesis, less clinical concern for epidural abscess or osteomyelitis based on patient history.  Overall with high clinical suspicion for lumbar sprain based on clinical presentation, risk factors.  No neurological deficits. No warning symptoms of back pain including: fecal incontinence, urinary retention or overflow incontinence, night sweats, waking from sleep with back pain, unexplained fevers or weight loss, h/o cancer, IVDU, recent trauma. Overall low clinical concern for cauda equina, epidural abscess, or other serious cause of back pain.  Given this work-up, evaluation, physical exam I do not believe that radiographic imaging is indicated at this time.  I did consider MRI imaging given pain and difficulty with ambulation, offered to patient who declined at this time. Would prefer to try conservative management first. Conservative measures such as rest, ice/heat, ibuprofen, Tylenol, and  prescription for Robaxin  indicated with orthopedic follow-up if no improvement with conservative management.  Patient advised not to drive or operate heavy machinery while taking Robaxin .  Extensive return precautions given. Evaluation and diagnostic testing in the emergency department does not suggest an emergent condition requiring  admission  or immediate intervention beyond what has been performed at this time.  Plan for discharge with close PCP follow-up.  Patient is understanding and amenable with plan, educated on red flag symptoms that would prompt immediate return.  Patient discharged in stable condition.  Final diagnoses:  Strain of lumbar region, initial encounter    ED Discharge Orders          Ordered    methocarbamol  (ROBAXIN ) 500 MG tablet  2 times daily        05/19/24 1626          An After Visit Summary was printed and given to the patient.      Nora Lauraine DELENA DEVONNA 05/19/24 [id number]  "

## 2024-05-19 NOTE — Discharge Instructions (Addendum)
 Your back pain is most likely due to a muscular strain.  There is been a lot of research on back pain, unfortunately the only thing that seems to really help is Tylenol and ibuprofen.  Relative rest is also important to not lift greater than 10 pounds bending or twisting at the waist.  Please follow-up with your family physician.  The other thing that really seems to benefit patients is physical therapy which your doctor may send you for.  Please return to the emergency department for new numbness or weakness to your arms or legs. Difficulty with urinating or urinating or pooping on yourself.  Also if you cannot feel toilet paper when you wipe or get a fever.   Take 4 over the counter ibuprofen tablets 3 times a day or 2 over-the-counter naproxen tablets twice a day for pain. Also take tylenol 1000mg (2 extra strength) four times a day.   I have also given you a prescription for Robaxin  which is a muscle relaxer you can take as prescribed as needed.  Do not drive or operate machinery while taking this medication as it can make you drowsy.  I have also given you a referral to orthopedics with a number to call to schedule an appointment.  Please call if in the next few days your pain is not improving.  I have also attached some stretching exercises to your paperwork which you can try to help relieve your symptoms.  Do not try these if they are painful.  Return if development of any new or worsening symptoms.

## 2024-05-19 NOTE — ED Triage Notes (Signed)
 Pt c/o lower back pain, radiating down R leg since appx 1000 today.  Reports intermittent numbness during ambulation.    Reports difficulty with R leg movement due to pain.  No drift noted, grip strength equal bilaterally.   Took naproxen appx 0900 today.

## 2024-05-19 NOTE — ED Notes (Signed)
 Reviewed discharge instructions, follow up, medications. Information provided regarding back exercises. Pt assisted to lobby via transport chair  awaiting son. Continues to have pain with movement
